# Patient Record
Sex: Male | Born: 1965 | Race: Black or African American | Hispanic: No | Marital: Married | Smoking: Light tobacco smoker
Health system: Southern US, Community
[De-identification: ages and names within clinical notes are randomized; demographics above are authoritative.]

## PROBLEM LIST (undated history)

## (undated) ENCOUNTER — Ambulatory Visit: Payer: 59 | Source: Home / Self Care

## (undated) DIAGNOSIS — K219 Gastro-esophageal reflux disease without esophagitis: Secondary | ICD-10-CM

## (undated) DIAGNOSIS — N419 Inflammatory disease of prostate, unspecified: Secondary | ICD-10-CM

## (undated) DIAGNOSIS — G4733 Obstructive sleep apnea (adult) (pediatric): Secondary | ICD-10-CM

## (undated) DIAGNOSIS — E7800 Pure hypercholesterolemia, unspecified: Secondary | ICD-10-CM

## (undated) DIAGNOSIS — G4730 Sleep apnea, unspecified: Secondary | ICD-10-CM

## (undated) DIAGNOSIS — J309 Allergic rhinitis, unspecified: Secondary | ICD-10-CM

## (undated) DIAGNOSIS — G4710 Hypersomnia, unspecified: Secondary | ICD-10-CM

## (undated) DIAGNOSIS — B353 Tinea pedis: Secondary | ICD-10-CM

## (undated) DIAGNOSIS — T7840XA Allergy, unspecified, initial encounter: Secondary | ICD-10-CM

## (undated) HISTORY — DX: Tinea pedis: B35.3

## (undated) HISTORY — DX: Allergic rhinitis, unspecified: J30.9

## (undated) HISTORY — DX: Hypersomnia, unspecified: G47.10

## (undated) HISTORY — DX: Allergy, unspecified, initial encounter: T78.40XA

## (undated) HISTORY — DX: Gastro-esophageal reflux disease without esophagitis: K21.9

## (undated) HISTORY — DX: Pure hypercholesterolemia, unspecified: E78.00

## (undated) HISTORY — DX: Inflammatory disease of prostate, unspecified: N41.9

## (undated) HISTORY — PX: TOE AMPUTATION: SHX809

## (undated) HISTORY — DX: Sleep apnea, unspecified: G47.30

## (undated) HISTORY — DX: Obstructive sleep apnea (adult) (pediatric): G47.33

---

## 1998-09-14 ENCOUNTER — Emergency Department (HOSPITAL_COMMUNITY): Admission: EM | Admit: 1998-09-14 | Discharge: 1998-09-15 | Payer: Self-pay | Admitting: Emergency Medicine

## 1998-09-16 ENCOUNTER — Encounter: Admission: RE | Admit: 1998-09-16 | Discharge: 1998-09-16 | Payer: Self-pay | Admitting: *Deleted

## 1998-10-03 ENCOUNTER — Encounter: Admission: RE | Admit: 1998-10-03 | Discharge: 1999-01-01 | Payer: Self-pay | Admitting: *Deleted

## 2003-06-02 ENCOUNTER — Emergency Department (HOSPITAL_COMMUNITY): Admission: EM | Admit: 2003-06-02 | Discharge: 2003-06-02 | Payer: Self-pay | Admitting: Emergency Medicine

## 2003-06-02 ENCOUNTER — Encounter: Payer: Self-pay | Admitting: Emergency Medicine

## 2003-06-09 ENCOUNTER — Inpatient Hospital Stay (HOSPITAL_COMMUNITY): Admission: AD | Admit: 2003-06-09 | Discharge: 2003-06-15 | Payer: Self-pay | Admitting: Orthopedic Surgery

## 2003-06-15 ENCOUNTER — Encounter: Payer: Self-pay | Admitting: Orthopedic Surgery

## 2003-06-22 ENCOUNTER — Ambulatory Visit (HOSPITAL_COMMUNITY): Admission: RE | Admit: 2003-06-22 | Discharge: 2003-06-23 | Payer: Self-pay | Admitting: Orthopedic Surgery

## 2004-09-28 ENCOUNTER — Ambulatory Visit (HOSPITAL_BASED_OUTPATIENT_CLINIC_OR_DEPARTMENT_OTHER): Admission: RE | Admit: 2004-09-28 | Discharge: 2004-09-28 | Payer: Self-pay | Admitting: Orthopedic Surgery

## 2005-09-05 ENCOUNTER — Ambulatory Visit: Payer: Self-pay | Admitting: Internal Medicine

## 2005-10-26 ENCOUNTER — Ambulatory Visit: Payer: Self-pay | Admitting: Internal Medicine

## 2006-04-23 ENCOUNTER — Ambulatory Visit: Payer: Self-pay | Admitting: Internal Medicine

## 2007-03-25 ENCOUNTER — Ambulatory Visit: Payer: Self-pay | Admitting: Internal Medicine

## 2007-09-03 ENCOUNTER — Encounter: Payer: Self-pay | Admitting: Internal Medicine

## 2007-09-03 ENCOUNTER — Ambulatory Visit: Payer: Self-pay | Admitting: Internal Medicine

## 2007-09-03 DIAGNOSIS — J309 Allergic rhinitis, unspecified: Secondary | ICD-10-CM

## 2007-09-03 DIAGNOSIS — K219 Gastro-esophageal reflux disease without esophagitis: Secondary | ICD-10-CM

## 2007-09-03 DIAGNOSIS — B353 Tinea pedis: Secondary | ICD-10-CM

## 2007-09-03 HISTORY — DX: Gastro-esophageal reflux disease without esophagitis: K21.9

## 2007-09-03 HISTORY — DX: Allergic rhinitis, unspecified: J30.9

## 2007-09-03 HISTORY — DX: Tinea pedis: B35.3

## 2007-09-03 LAB — CONVERTED CEMR LAB
Albumin: 4.4 g/dL (ref 3.5–5.2)
Basophils Absolute: 0 10*3/uL (ref 0.0–0.1)
Cholesterol: 217 mg/dL (ref 0–200)
Creatinine, Ser: 1.1 mg/dL (ref 0.4–1.5)
Direct LDL: 154.5 mg/dL
HCT: 42.4 % (ref 39.0–52.0)
Hemoglobin, Urine: NEGATIVE
Hemoglobin: 14.2 g/dL (ref 13.0–17.0)
Leukocytes, UA: NEGATIVE
Lymphocytes Relative: 38.8 % (ref 12.0–46.0)
MCHC: 33.4 g/dL (ref 30.0–36.0)
MCV: 86 fL (ref 78.0–100.0)
Monocytes Absolute: 0.6 10*3/uL (ref 0.2–0.7)
Neutro Abs: 4.2 10*3/uL (ref 1.4–7.7)
Neutrophils Relative %: 51.1 % (ref 43.0–77.0)
PSA: 0.5 ng/mL (ref 0.10–4.00)
Potassium: 4.8 meq/L (ref 3.5–5.1)
RDW: 12.8 % (ref 11.5–14.6)
Sodium: 148 meq/L — ABNORMAL HIGH (ref 135–145)
Total Bilirubin: 1 mg/dL (ref 0.3–1.2)
Total Protein: 7.8 g/dL (ref 6.0–8.3)
Urobilinogen, UA: 0.2 (ref 0.0–1.0)
pH: 6 (ref 5.0–8.0)

## 2007-12-03 ENCOUNTER — Ambulatory Visit: Payer: Self-pay | Admitting: Internal Medicine

## 2008-04-20 ENCOUNTER — Ambulatory Visit: Payer: Self-pay | Admitting: Internal Medicine

## 2008-04-20 DIAGNOSIS — J069 Acute upper respiratory infection, unspecified: Secondary | ICD-10-CM | POA: Insufficient documentation

## 2008-05-31 ENCOUNTER — Ambulatory Visit: Payer: Self-pay | Admitting: Internal Medicine

## 2009-02-25 ENCOUNTER — Encounter: Payer: Self-pay | Admitting: Internal Medicine

## 2009-07-13 ENCOUNTER — Ambulatory Visit: Payer: Self-pay | Admitting: Internal Medicine

## 2009-08-26 ENCOUNTER — Ambulatory Visit: Payer: Self-pay | Admitting: Internal Medicine

## 2009-08-26 DIAGNOSIS — G4710 Hypersomnia, unspecified: Secondary | ICD-10-CM

## 2009-08-26 DIAGNOSIS — M79609 Pain in unspecified limb: Secondary | ICD-10-CM

## 2009-08-26 DIAGNOSIS — E7800 Pure hypercholesterolemia, unspecified: Secondary | ICD-10-CM

## 2009-08-26 HISTORY — DX: Pure hypercholesterolemia, unspecified: E78.00

## 2009-08-26 HISTORY — DX: Hypersomnia, unspecified: G47.10

## 2009-08-26 LAB — CONVERTED CEMR LAB
ALT: 38 units/L (ref 0–53)
Alkaline Phosphatase: 64 units/L (ref 39–117)
Bilirubin, Direct: 0 mg/dL (ref 0.0–0.3)
CO2: 32 meq/L (ref 19–32)
Chloride: 107 meq/L (ref 96–112)
Sodium: 142 meq/L (ref 135–145)
Total CHOL/HDL Ratio: 4
Total Protein: 7.9 g/dL (ref 6.0–8.3)
Triglycerides: 90 mg/dL (ref 0.0–149.0)

## 2009-09-05 ENCOUNTER — Ambulatory Visit: Payer: Self-pay | Admitting: Pulmonary Disease

## 2009-09-06 ENCOUNTER — Encounter: Payer: Self-pay | Admitting: Pulmonary Disease

## 2009-09-06 ENCOUNTER — Ambulatory Visit (HOSPITAL_BASED_OUTPATIENT_CLINIC_OR_DEPARTMENT_OTHER): Admission: RE | Admit: 2009-09-06 | Discharge: 2009-09-06 | Payer: Self-pay | Admitting: Pulmonary Disease

## 2009-09-13 ENCOUNTER — Ambulatory Visit: Payer: Self-pay | Admitting: Pulmonary Disease

## 2009-09-14 ENCOUNTER — Telehealth (INDEPENDENT_AMBULATORY_CARE_PROVIDER_SITE_OTHER): Payer: Self-pay | Admitting: *Deleted

## 2009-09-21 ENCOUNTER — Ambulatory Visit: Payer: Self-pay | Admitting: Pulmonary Disease

## 2009-09-21 DIAGNOSIS — G4733 Obstructive sleep apnea (adult) (pediatric): Secondary | ICD-10-CM

## 2009-09-21 HISTORY — DX: Obstructive sleep apnea (adult) (pediatric): G47.33

## 2009-10-15 ENCOUNTER — Encounter: Payer: Self-pay | Admitting: Pulmonary Disease

## 2009-10-24 ENCOUNTER — Ambulatory Visit: Payer: Self-pay | Admitting: Pulmonary Disease

## 2009-12-17 ENCOUNTER — Encounter: Payer: Self-pay | Admitting: Pulmonary Disease

## 2010-04-24 ENCOUNTER — Ambulatory Visit: Payer: Self-pay | Admitting: Pulmonary Disease

## 2011-01-09 NOTE — Miscellaneous (Signed)
Summary: optimal pressure 14cm.  Clinical Lists Changes  Orders: Added new Referral order of DME Referral (DME) - Signed poor compliance...20% >= to 4 hrs.  optimal pressure appears to be 14cm
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
## 2011-04-27 NOTE — Discharge Summary (Signed)
NAME:  Collin Murphy, Collin Murphy                       ACCOUNT NO.:  [id number]   MEDICAL RECORD NO.:  [id number]                   PATIENT TYPE:  INP   LOCATION:  5015                                 FACILITY:  [facility]   PHYSICIAN:  Elana Alm. Thurston Hole, M.D.              DATE OF BIRTH:  12/14/1965   DATE OF ADMISSION:  06/09/2003  DATE OF DISCHARGE:  06/15/2003                                 DISCHARGE SUMMARY   ADMITTING DIAGNOSES:  1. Crush injury, left foot.  2. Necrotizing fasciitis, left foot.  3. Multiple fractures left foot.   DISCHARGE DIAGNOSES:  Left foot crush injury with infection and multiple  fractures.   HOSPITAL COURSE:  The patient is a 45 year old white male who underwent a  crush injury on June 23, _____.  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  He  went to [employer].  At that time underwent sutures of his lacerations  and was referred to orthopedics for care.  Was seen the next day.  Had a  markedly swollen foot.  No significant infection.  However, four days later  he returned to the office with a significant amount of swelling, significant  odorous drainage, and questionable necrotic toes third and fourth toes.  He  was taken to the operating room that night.  Underwent I&D by Elana Alm.  Thurston Hole, M.D.  He tolerated the procedure well.  Toes three and four were  still poorly vascularized.  Postoperative day one he began having bed side  irrigation and debridement and infectious disease was consulted for  antibiotic dosing.  Postoperative day two foot was dressed.  Third and  fourth toes were dusky.  His left calf was tender.  Doppler was ordered and  came back negative.  The patient was consented for a second I&D.  Postoperative day three patient underwent second I&D.  Placement of a wound  vac was done.  At that time the wound was clean with no odor.  Fourth toe  was still moderately dusky.  Third toe was only mildly dusky.  At that time  it was elected not to  amputate any toes for the possibility of them  revascularizing.  Postoperative day four/day one PICC line was ordered for  IV antibiotics.  The patient was tolerating the vac well.  His PCA was  discontinued.  Cultures showed gram-positive cocci in pairs.  Postoperative  day five/two patient was on Zosyn 4.5 mg IV q.8h., Percocet.  His PICC line  was inserted.  Postoperative day six/three home vac was applied so that he  was discharged to home in stable condition.  He will follow up with Nadara Mustard, M.D. for care of his foot.  He will be on IV antibiotics for six  weeks.  He will be Zosyn for two more weeks IV and then probably p.o.  antibiotics.  He is nonweightbearing on that foot.  He will  have vac changes  three times a week.  He will follow up with Nadara Mustard, M.D. on Friday.   DISCHARGE MEDICATIONS:  1. Zosyn 4.5 mg IV q.8h.  2. Percocet one to two q.4-6h. p.r.n. pain.  3. Robaxin 500 mg one p.o. q.6h. p.r.n. spasm.  4. Lovenox 40 mg subcutaneous daily until July 10.      Kirstin Shepperson, P.A.                  Robert A. Thurston Hole, M.D.    KS/MEDQ  D:  07/07/2003  T:  07/08/2003  Job:  [id number]

## 2011-04-27 NOTE — Op Note (Signed)
NAME:  Collin Murphy, Collin Murphy                       ACCOUNT NO.:  [id number]   MEDICAL RECORD NO.:  [id number]                   PATIENT TYPE:  INP   LOCATION:  5015                                 FACILITY:  [facility]   PHYSICIAN:  Elana Alm. Thurston Hole, M.D.              DATE OF BIRTH:  22-Jun-1966   DATE OF PROCEDURE:  06/09/2003  DATE OF DISCHARGE:                                 OPERATIVE REPORT   PREOPERATIVE DIAGNOSIS:  Severe crush injury, left foot, with deep foot  infection.   POSTOPERATIVE DIAGNOSIS:  Severe crush injury, left foot, with deep foot  infection, with possible devasculature of left third and fourth toes.   PROCEDURES:  1. Left foot irrigation and debridement.  2. Left foot fasciotomy.   SURGEON:  Dr. Salvatore Marvel.   ANESTHESIA:  General.   OPERATIVE TIME:  45 minutes.   COMPLICATIONS:  None.   DESCRIPTION OF PROCEDURE:  Collin Murphy is a 45 year old gentleman who had  sustained a severe left foot crush injury one week ago.  He has been on  antibiotics and non-weightbearing.  However, he has developed increase pain  and swelling in the foot over the past 24 hours with obvious infection at  this point with possible compartment syndrome and vascular compromise to his  third and fourth toes and is now to undergo thorough irrigation and  debridement and compartment fasciotomies of this.  Collin Murphy was brought to  the operating room on June 09, 2003 and placed on the operative table in the  supine position.  After an adequate level of general anesthesia was  obtained, his left foot and leg was prepped using sterile Betadine and  draped using sterile technique.  Initially, the foot and leg was  exsanguinated and a calf tourniquet elevated 300 mm.  Two separate incisions  were made, one over the fourth toe extending this all the way to the  proximal aspect of the mid foot, and one incision over the third toe  obliquely away from the first incision.  There was found to  be significant  serous purulent drainage and exuding purulence from the incisions which were  thoroughly cultured.  Underneath this was found to be deep purulent hematoma  in the dorsum of the foot, which was thoroughly debrided.  There was also  superficial epidermolysis over and around these crush injury areas and these  were debrided as well.  There was found to be significant deep infection  which was thoroughly debrided down to his extensor tendons and compartment  fasciotomies through the dorsum of the foot were carried out.  After this  was done and 7,000 cc of saline and antibiotic solution were jet lavaged  irrigated through these incisions and in and around all these areas in the  foot, the tourniquet was released and there was found to be some increased  improvement in the viability of the third toe, however, the fourth  toe  remained significantly difficult to evaluate for viable vascularity.  It  was, however, maintained at this point to be observed.  After this thorough  debridement and irrigation had been carried out, further irrigation was also  performed with saline and antibiotic solution.  At this point then, the  wounds were packed open with saline soaked Kerlix and sterile dressings were  applied.  The patient then awakened and taken to the recovery room in stable  condition.  Needle and sponge counts correct times two at the end of the  case.   FOLLOW-UP CARE:  Collin Murphy will be followed very closely for monitoring  the viability of his toes and his foot.  He is placed on IV antibiotics of  both Zosyn and vancomycin per infectious disease consultation and these  antibiotics will be adjusted accordingly based on cultures which were  obtained, both aerobic and anaerobic cultures obtained intraoperatively  today of the fluids and of the deep infected hematoma.                                               Robert A. Thurston Hole, M.D.    RAW/MEDQ  D:  06/09/2003  T:   06/09/2003  Job:  [id number]

## 2011-04-27 NOTE — Op Note (Signed)
NAME:  Collin Murphy, Collin Murphy                      ACCOUNT NO.:  [id number]   MEDICAL RECORD NO.:  [id number]                   PATIENT TYPE:  OIB   LOCATION:                                       FACILITY:  [facility]   PHYSICIAN:  Nadara Mustard, M.D.                DATE OF BIRTH:  [birth year]   DATE OF PROCEDURE:  06/22/2003  DATE OF DISCHARGE:                                 OPERATIVE REPORT   PREOPERATIVE DIAGNOSES:  1. Ischemic, necrotic left third and fourth toes, status post crush injury,     left foot.  2. Large open fasciotomy wounds x2, dorsum left foot.   PROCEDURES:  1. Irrigation and debridement of skin, soft tissue, and bone.  2. Amputation of third and fourth toes at the metatarsophalangeal joint.  3. Application of Apligraf.   SURGEON:  Nadara Mustard, M.D.   ANESTHESIA:  General.   ESTIMATED BLOOD LOSS:  Minimal.   ANTIBIOTICS:  1 g of Kefzol.   TOURNIQUET TIME:  None.   DISPOSITION:  To PACU in stable condition.  Plan for 23-hour observation.   INDICATIONS FOR PROCEDURE:  The patient is a 45 year old gentleman who is  status post a crush injury to his left foot.  He initially underwent  irrigation and debridement, fasciotomies, and application of a Wound V.A.C.,  and presents at this time for removal of the V.A.C., amputation of the  ischemic, necrotic left third and fourth toes, irrigation and debridement of  skin, soft tissue, and bone, and application of Apligraf.  Risks and  benefits of surgery were discussed including infection, neurovascular  injury, nonhealing of the wounds, need for additional surgery.  The patient  states he understands and wishes to proceed at this time.   DESCRIPTION OF PROCEDURE:  The patient was brought to OR Room #5 and  underwent a general LMA anesthetic.  After adequate level of anesthesia was  obtained, the patient's left lower extremity was prepped using Betadine  paint and draped into a sterile field.  Attention was first  focused on the  ischemic, necrotic third and fourth toes.  The toes were black, had no skin  turgor, had no bleeding.  The toes were amputated to the viable tissue at  the MTP joints.  The skin, soft tissue, and bone was then irrigated with  normal saline.  Hemostasis was then obtained.  The wound was closed using 2-  0 nylon with a far-near, near-far stitch.  The Apligraf was then meshed on  the back  table with a 1:1.5 mesher.  This was cut in two sections and was placed over  the two open fasciotomy wounds.  Mepitel dressing was then applied,  Bactroban cream, 4 x 4's, sterile Webril, and loosely-wrapped Coban.  The  patient was extubated and taken to PACU in stable condition.  Plan for 23-  hour observation and discharge to home.  Nadara Mustard, M.D.    MVD/MEDQ  D:  06/22/2003  T:  06/22/2003  Job:  [id number]

## 2011-04-27 NOTE — Op Note (Signed)
NAME:  Collin Murphy, Collin Murphy                       ACCOUNT NO.:  [id number]   MEDICAL RECORD NO.:  [id number]                   PATIENT TYPE:  INP   LOCATION:  5015                                 FACILITY:  [facility]   PHYSICIAN:  Elana Alm. Thurston Hole, M.D.              DATE OF BIRTH:  July 17, 1966   DATE OF PROCEDURE:  06/12/2003  DATE OF DISCHARGE:                                 OPERATIVE REPORT   PREOPERATIVE DIAGNOSIS:  Crush injury, left foot, with infection.   POSTOPERATIVE DIAGNOSIS:  Crush injury, left foot, with infection.   PROCEDURE:  Irrigation and debridement of left foot with application of VAC.   SURGEON:  Elana Alm. Thurston Hole, M.D.   ASSISTANT:  1. Nadara Mustard, M.D.  2. Kirstin Shepperson, P.A.   ANESTHESIA:  General.   OPERATIVE TIME:  30 minutes.   COMPLICATIONS:  None.   DESCRIPTION OF PROCEDURE:  Mr. Atallah was brought to the operating room on  June 12, 2003, and placed on the operating room table in the supine position.  After an adequate level of general anesthesia was obtained, his left foot  and leg was prepped using sterile Betadine and draped using sterile  technique.  Initially, the wound was evaluated.  There was some skin edge  necrosis around the wound which was debrided.  The third toe had  approximately 30 to 40% viability on the plantar surface; the rest of it  looked questionable for vascularity and viability, however, it was felt that  there was still a possibility that this toe might very well survive, and  thus it was not removed.  The fourth toe showed about 10 to 20% viability on  the plantar surface.  The dorsal surface again appeared questionable for  viability, but again with the possibility that it could possible be viable;  this toe was not removed either.  Thorough irrigation with saline and pulse  lavage was carried out.  There was excellent clean granulation tissue in the  dorsal bed of the wounds.  There was a skin bridge on the dorsum  which also  had questionable viability, but the undersurface of this was viable, and  thus it was left intact as well.  After this was done, then the [facility] system  was placed on the open wounds and sealed with Ioban.  The system was turned  on, and then the patient was awakened and taken to recovery room in stable  condition.  Needle and sponge counts were correct x2 at the end of the case.                                               Robert A. Thurston Hole, M.D.    RAW/MEDQ  D:  06/12/2003  T:  06/13/2003  Job:  [id number]

## 2011-04-27 NOTE — Op Note (Signed)
NAME:  Collin Murphy, Collin Murphy             ACCOUNT NO.:  [id number]   MEDICAL RECORD NO.:  [id number]          PATIENT TYPE:  AMB   LOCATION:  DSC                          FACILITY:  [facility]   PHYSICIAN:  Nadara Mustard, M.D.   DATE OF BIRTH:  [birth year]   DATE OF PROCEDURE:  09/28/2004  DATE OF DISCHARGE:                                 OPERATIVE REPORT   PREOPERATIVE DIAGNOSIS:  Metatarsalgia, left second metatarsal.   POSTOPERATIVE DIAGNOSIS:  Metatarsalgia, left second metatarsal.   PROCEDURE:  Left second metatarsal Weil osteotomy.   SURGEON:  Nadara Mustard, M.D.   ANESTHESIA:  LMA, which was converted to general endotracheal.   ESTIMATED BLOOD LOSS:  Minimal.   ANTIBIOTICS:  1 g Kefzol.   TOURNIQUET TIME:  Esmarch at the ankle for approximately 18 minutes.   DISPOSITION:  To PACU in stable condition.   INDICATION FOR PROCEDURE:  The patient is a 45 year old gentleman status  post a massive crush injury to the left foot, who has undergone amputations  of metatarsals of toes three and four.  The patient has had progressive  metatarsalgia due to a long, prominent second metatarsal and has developed  clawing of the second toe and presents at this time for a Weil osteotomy.  The risks and benefits were discussed, including infection, neurovascular  injury, persistent pain, nonunion of bone, nonhealing of the wound secondary  to previous trauma.  The patient states he understands and wishes to proceed  at this time.   DESCRIPTION OF PROCEDURE:  The patient was brought to OR room 5 and  underwent a general anesthetic.  After adequate level of anesthesia  obtained, the patient's left lower extremity was prepped using Duraprep and  draped into a sterile field.  The Esmarch was wrapped around the ankle for  tourniquet control.  A small 2 cm incision was made dorsally over the MTP  joint of the second toe.  This was carried sharply down to the MTP joint.  The capsule was released and  retractors were placed.  A Weil osteotomy was  performed.  The metatarsal head was transposed proximally and a 12 mm spin  screw was used to stabilize the metatarsal head.  The bony overhanging  prominence was then removed with the oscillating saw.  The wound was  cleansed.  The Esmarch was removed, and  hemostasis was obtained.  The skin was closed using a vertical mattress with  3-0 nylon.  The wound was covered with Adaptic, orthopedic sponges, sterile  Webril, and a Coban dressing.  The patient was extubated and taken to the  PACU in stable condition, planned for follow-up in the office in two weeks.       MVD/MEDQ  D:  09/28/2004  T:  09/28/2004  Job:  [id number]

## 2011-12-17 ENCOUNTER — Telehealth: Payer: Self-pay | Admitting: Internal Medicine

## 2011-12-17 NOTE — Telephone Encounter (Signed)
The pt called and is wanting to get in as soon as possible for congestion.  The patient was offered and apt on Wednesday with a different dr.  He was also informed that he could call back in the morning to take a same day apt.  The pt stated he would call back at 8am.

## 2011-12-18 ENCOUNTER — Encounter: Payer: Self-pay | Admitting: Endocrinology

## 2011-12-18 ENCOUNTER — Ambulatory Visit (INDEPENDENT_AMBULATORY_CARE_PROVIDER_SITE_OTHER): Payer: Managed Care, Other (non HMO) | Admitting: Endocrinology

## 2011-12-18 DIAGNOSIS — J069 Acute upper respiratory infection, unspecified: Secondary | ICD-10-CM

## 2011-12-18 MED ORDER — CEFUROXIME AXETIL 250 MG PO TABS: 250.0000 mg | ORAL_TABLET | Freq: Two times a day (BID) | ORAL | Status: AC

## 2011-12-18 MED ORDER — PROMETHAZINE-DM 6.25-15 MG/5ML PO SYRP: 5.0000 mL | ORAL_SOLUTION | Freq: Four times a day (QID) | ORAL | Status: AC | PRN

## 2011-12-18 MED ORDER — FLUTICASONE-SALMETEROL 100-50 MCG/DOSE IN AEPB: 1.0000 | INHALATION_SPRAY | Freq: Two times a day (BID) | RESPIRATORY_TRACT | Status: DC

## 2011-12-18 NOTE — Progress Notes (Signed)
  Subjective:    Patient ID: Collin Murphy, male    DOB: 1966-03-14, 46 y.o.   MRN: [id number]  HPI Pt states few days of slight "fullness" sensation at the right ear, but no assoc pain.  He has a slight dry cough, and wheezing in the chest. Past Medical History  Diagnosis Date  . ALLERGIC RHINITIS 09/03/2007  . GERD 09/03/2007  . HYPERCHOLESTEROLEMIA 08/26/2009  . HYPERSOMNIA 08/26/2009  . OBSTRUCTIVE SLEEP APNEA 09/21/2009  . TINEA PEDIS 09/03/2007  . Prostatitis     multiple episodes    Past Surgical History  Procedure Date  . Toe amputation     3rd and 4th toes amputated after forklift injury    History   Social History  . Marital Status: Married    Spouse Name: N/A    Number of Children: N/A  . Years of Education: N/A   Occupational History  . Mental health and Professional barber [employer]   Social History Main Topics  . Smoking status: Never Smoker   . Smokeless tobacco: Not on file  . Alcohol Use: Not on file  . Drug Use: Not on file  . Sexually Active: Not on file   Other Topics Concern  . Not on file   Social History Narrative   [employer]-major sociologyMarried-1990Children : 1 boy '93, 1 girl '98    No current outpatient prescriptions on file prior to visit.    No Known Allergies  Family History  Problem Relation Age of Onset  . Hypertension Mother   . Heart disease Mother     CAD  . Diabetes Father   . Cancer Neg Hx     No history Colon, Prostate Breast CA    BP 130/82  Pulse 57  Temp(Src) 98.3 F (36.8 C) (Oral)  Ht 5\' 9"  (1.753 m)  Wt 281 lb 1.9 oz (127.515 kg)  BMI 41.51 kg/m2  SpO2 95%    Review of Systems Denies nasal congestion and fever.    Objective:   Physical Exam VITAL SIGNS:  See vs page GENERAL: no distress head: no deformity eyes: no periorbital swelling, no proptosis external nose and ears are normal mouth: no lesion seen Both eac's are normal, but the tm's are red       Assessment & Plan:    URI, new

## 2011-12-18 NOTE — Patient Instructions (Addendum)
i have sent a prescription to your pharmacy, for an antibiotic, inhaler, and for cough medication.   I hope you feel better soon.  If you don't feel better by next week, please call your doctor.

## 2013-03-04 ENCOUNTER — Ambulatory Visit (INDEPENDENT_AMBULATORY_CARE_PROVIDER_SITE_OTHER): Payer: Managed Care, Other (non HMO) | Admitting: Internal Medicine

## 2013-03-04 ENCOUNTER — Encounter: Payer: Self-pay | Admitting: Internal Medicine

## 2013-03-04 VITALS — BP 126/70 | HR 64 | Temp 98.1°F | Resp 12 | Ht 69.0 in | Wt 269.0 lb

## 2013-03-04 DIAGNOSIS — R739 Hyperglycemia, unspecified: Secondary | ICD-10-CM

## 2013-03-04 DIAGNOSIS — Z0000 Encounter for general adult medical examination without abnormal findings: Secondary | ICD-10-CM

## 2013-03-04 DIAGNOSIS — E7800 Pure hypercholesterolemia, unspecified: Secondary | ICD-10-CM

## 2013-03-04 DIAGNOSIS — R7309 Other abnormal glucose: Secondary | ICD-10-CM

## 2013-03-04 DIAGNOSIS — G4733 Obstructive sleep apnea (adult) (pediatric): Secondary | ICD-10-CM

## 2013-03-04 NOTE — Progress Notes (Signed)
Subjective:    Patient ID: Collin Murphy, male    DOB: Jun 09, 1966, 47 y.o.   MRN: [id number]  HPI Collin Murphy has not been seen since 2010. Collin Murphy presents today for annual exam.  Collin Murphy reports that Collin Murphy has pedal edema, more left than right. The swelling is progressive during the course of the day. As the leg swells the pain gets worse. Collin Murphy is also having some pain in the buttock and calve.  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   Collin Murphy has a h/o OSA with sleep study in 2010 with AHI 22. Collin Murphy was not able to continue C-PAP due to $$.   Collin Murphy has not had any major medical illness, surgery or injury. Is current with the dentist. Collin Murphy has had ophthal exam last month. No regular exercise program. Diet is pretty healthy.  Past Medical History  Diagnosis Date  . ALLERGIC RHINITIS 09/03/2007  . GERD 09/03/2007  . HYPERCHOLESTEROLEMIA 08/26/2009  . HYPERSOMNIA 08/26/2009  . OBSTRUCTIVE SLEEP APNEA 09/21/2009  . TINEA PEDIS 09/03/2007  . Prostatitis     multiple episodes   Past Surgical History  Procedure Laterality Date  . Toe amputation      3rd and 4th toes amputated after forklift injury   Family History  Problem Relation Age of Onset  . Hypertension Mother   . Heart disease Mother     CAD  . Diabetes Father   . Cancer Neg Hx     No history Colon, Prostate Breast CA  . Gout Brother   . COPD Brother    History   Social History  . Marital Status: Married    Spouse Name: N/A    Number of Children: 2  . Years of Education: 16   Occupational History  . Mental health and Professional barber [employer]   Social History Main Topics  . Smoking status: Never Smoker   . Smokeless tobacco: Never Used  . Alcohol Use: Yes     Comment: occ/rare  . Drug Use: No  . Sexually Active: Yes -- Male partner(s)   Other Topics Concern  . Not on file   Social History Narrative   HSG. College [employer]ology. Completed barber[employer] college. Married-1990   Children : 1  boy '93, 1 girl '98. Work Emergency planning/management officer.             Current Outpatient Prescriptions on File Prior to Visit  Medication Sig Dispense Refill  . Fluticasone-Salmeterol (ADVAIR DISKUS) 100-50 MCG/DOSE AEPB Inhale 1 puff into the lungs 2 (two) times daily.  1 each  2   No current facility-administered medications on file prior to visit.      Review of Systems Constitutional:  Negative for fever, chills, activity change and unexpected weight change.  HEENT:  Negative for hearing loss, ear pain, congestion, neck stiffness and postnasal drip. Negative for sore throat or swallowing problems. Negative for dental complaints.   Eyes: Negative for vision loss or change in visual acuity.  Respiratory: Negative for chest tightness and wheezing. Negative for DOE.   Cardiovascular: Negative for chest pain or palpitations. No decreased exercise tolerance Gastrointestinal: No change in bowel habit. No bloating or gas. No reflux or indigestion Genitourinary: Negative for urgency, frequency, flank pain and difficulty urinating.  Musculoskeletal: Negative for myalgias, back pain, arthralgias and gait problem.  Neurological: Negative for dizziness, tremors, weakness and headaches.  Hematological: Negative for adenopathy.  Psychiatric/Behavioral: Negative for behavioral problems and dysphoric mood.  Objective:   Physical Exam Filed Vitals:   03/04/13 1328  BP: 126/70  Pulse: 64  Temp: 98.1 F (36.7 C)  Resp: 12   Wt Readings from Last 3 Encounters:  03/04/13 269 lb (122.018 kg)  12/18/11 281 lb 1.9 oz (127.515 kg)  04/24/10 276 lb (125.193 kg)   Gen'l: Well nourished well developed AA male in no acute distress  HEENT: Head: Normocephalic and atraumatic. Right Ear: External ear normal. EAC/TM nl. Left Ear: External ear normal.  EAC/TM nl. Nose: Nose normal. Mouth/Throat: Oropharynx is clear and moist. Dentition - native, in good repair. No buccal or palatal lesions. Posterior pharynx clear.  Eyes: Conjunctivae and sclera clear. EOM intact. Pupils are equal, round, and reactive to light. Right eye exhibits no discharge. Left eye exhibits no discharge. Neck: Normal range of motion. Neck supple. No JVD present. No tracheal deviation present. No thyromegaly present.  Cardiovascular: Normal rate, regular rhythm, no gallop, no friction rub, no murmur heard.      Quiet precordium. 2+ radial and DP pulses . No carotid bruits Pulmonary/Chest: Effort normal. No respiratory distress or increased WOB, no wheezes, no rales. No chest wall deformity or CVAT. Abdomen: Soft. Bowel sounds are normal in all quadrants. Collin Murphy exhibits no distension, no tenderness, no rebound or guarding, No heptosplenomegaly  Genitourinary:  deferred Musculoskeletal: Normal range of motion. Collin Murphy exhibits no edema and no tenderness.       Small and large joints without redness, synovial thickening or deformity. Full range of motion preserved about all small, median and large joints.  Lymphadenopathy:    Collin Murphy has no cervical or supraclavicular adenopathy.  Neurological: Collin Murphy is alert and oriented to person, place, and time. CN II-XII intact. DTRs 2+ and symmetrical biceps, radial and patellar tendons. Cerebellar function normal with no tremor, rigidity, normal gait and station.  Skin: Skin is warm and dry. No rash noted. No erythema.  Psychiatric: Collin Murphy has a normal mood and affect. His behavior is normal. Thought content normal.         Assessment & Plan:

## 2013-03-04 NOTE — Patient Instructions (Addendum)
Thanks for coming back  Leg Swelling - venous insufficiency. A common problem of the venous circulation. Plan Knee high stockings,  Elevate legs  Weight management - the biggest factor in your health: /Diet management: smart food choices, PORTION SIZE CONTROL, regular exercise. Goal - to loose 1-2 lbs.month. Target weight - 220 lbs - 70 lbs to loose.  5-6 years.  Routine care - full labs when you come back.   Please sign up for my chart.     Venous Stasis and Chronic Venous Insufficiency As people age, the veins located in their legs may weaken and stretch. When veins weaken and lose the ability to pump blood effectively, the condition is called chronic venous insufficiency (CVI) or venous stasis. Almost all veins return blood back to the heart. This happens by:  The force of the heart pumping fresh blood pushes blood back to the heart.  Blood flowing to the heart from the force of gravity. In the deep veins of the legs, blood has to fight gravity and flow upstream back to the heart. Here, the leg muscles contract to pump blood back toward the heart. Vein walls are elastic, and many veins have small valves that only allow blood to flow in one direction. When leg muscles contract, they push inward against the elastic vein walls. This squeezes blood upward, opens the valves, and moves blood toward the heart. When leg muscles relax, the vein wall also relaxes and the valves inside the vein close to prevent blood from flowing backward. This method of pumping blood out of the legs is called the venous pump. CAUSES  The venous pump works best while walking and leg muscles are contracting. But when a person sits or stands, blood pressure in leg veins can build. Deep veins are usually able to withstand short periods of inactivity, but long periods of inactivity (and increased pressure) can stretch, weaken, and damage vein walls. High blood pressure can also stretch and damage vein walls. The veins may  no longer be able to pump blood back to the heart. Venous hypertension (high blood pressure inside veins) that lasts over time is a primary cause of CVI. CVI can also be caused by:   Deep vein thrombosis, a condition where a thrombus (blood clot) blocks blood flow in a vein.  Phlebitis, an inflammation of a superficial vein that causes a blood clot to form. Other risk factors for CVI may include:   Heredity.  Obesity.  Pregnancy.  Sedentary lifestyle.  Smoking.  Jobs requiring long periods of standing or sitting in one place.  Age and gender:  Women in their 56's and 68's and men in their 72's are more prone to developing CVI. SYMPTOMS  Symptoms of CVI may include:   Varicose veins.  Ulceration or skin breakdown.  Lipodermatosclerosis, a condition that affects the skin just above the ankle, usually on the inside surface. Over time the skin becomes brown, smooth, tight and often painful. Those with this condition have a high risk of developing skin ulcers.  Reddened or discolored skin on the leg.  Swelling. DIAGNOSIS  Your caregiver can diagnose CVI after performing a careful medical history and physical examination. To confirm the diagnosis, the following tests may also be ordered:   Duplex ultrasound.  Plethysmography (tests blood flow).  Venograms (x-ray using a special dye). TREATMENT The goals of treatment for CVI are to restore a person to an active life and to minimize pain or disability. Typically, CVI does not pose a  serious threat to life or limb, and with proper treatment most people with this condition can continue to lead active lives. In most cases, mild CVI can be treated on an outpatient basis with simple procedures. Treatment methods include:   Elastic compression socks.  Sclerotherapy, a procedure involving an injection of a material that "dissolves" the damaged veins. Other veins in the network of blood vessels take over the function of the damaged  veins.  Vein stripping (an older procedure less commonly used).  Laser Ablation surgery.  Valve repair. HOME CARE INSTRUCTIONS   Elastic compression socks must be worn every day. They can help with symptoms and lower the chances of the problem getting worse, but they do not cure the problem.  Only take over-the-counter or prescription medicines for pain, discomfort, or fever as directed by your caregiver.  Your caregiver will review your other medications with you. SEEK MEDICAL CARE IF:   You are confused about how to take your medications.  There is redness, swelling, or increasing pain in the affected area.  There is a red streak or line that extends up or down from the affected area.  There is a breakdown or loss of skin in the affected area, even if the breakdown is small.  You develop an unexplained oral temperature above 102 F (38.9 C).  There is an injury to the affected area. SEEK IMMEDIATE MEDICAL CARE IF:   There is an injury and open wound to the affected area.  Pain is not adequately relieved with pain medication prescribed or becomes severe.  An oral temperature above 102 F (38.9 C) develops.  The foot/ankle below the affected area becomes suddenly numb or the area feels weak and hard to move. MAKE SURE YOU:   Understand these instructions.  Will watch your condition.  Will get help right away if you are not doing well or get worse. Document Released: 04/01/2007 Document Revised: 02/18/2012 Document Reviewed: 06/09/2007 [facility] Patient Information 2013 [place], [place].

## 2013-03-05 DIAGNOSIS — Z0000 Encounter for general adult medical examination without abnormal findings: Secondary | ICD-10-CM | POA: Insufficient documentation

## 2013-03-05 NOTE — Assessment & Plan Note (Signed)
Reviewed the medical implications of morbid obesity and the critical importance of addressing this issue.  Plan - Diet management: smart food choices, PORTION SIZE CONTROL, regular exercise. Goal - to loose 1-2 lbs.month. Target weight - 220 lb

## 2013-03-05 NOTE — Assessment & Plan Note (Signed)
Patient's last LDL was better than goal of 130 or less  Plan Lipid panel with recommendations to follow

## 2013-03-05 NOTE — Assessment & Plan Note (Addendum)
Interval medical history is w/o major medical illness, surgery or injury. Physical exam is notable for left toe amputation and obesity. Labs ordered and pending. Immunization brought up to date. Discussed with his weight management. Reviewed erectile function which by his report is normal for his age. He has had by description minor external hemorrhoid and is advised in care. He does report peripheral edema that develops during the day c/w venous insufficiency and he is instructed in treatment. 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  In summary - a very nice man who, except for his weight, appears to be medically stable. If his labs are normal and he continues to do well he should return for full exam, 24 lbs lighter, in 2 years.

## 2013-03-05 NOTE — Assessment & Plan Note (Signed)
Reviewed his sleep study. Discussed the medical ramifications of untreated sleep apnea.  Plan He is to check, again, about insurance coverage for CPAP  Weight management.

## 2013-12-01 ENCOUNTER — Ambulatory Visit (INDEPENDENT_AMBULATORY_CARE_PROVIDER_SITE_OTHER): Payer: Managed Care, Other (non HMO) | Admitting: Internal Medicine

## 2013-12-01 ENCOUNTER — Encounter: Payer: Self-pay | Admitting: Internal Medicine

## 2013-12-01 VITALS — BP 134/70 | HR 76 | Temp 97.7°F | Wt 288.0 lb

## 2013-12-01 DIAGNOSIS — H9319 Tinnitus, unspecified ear: Secondary | ICD-10-CM

## 2013-12-01 DIAGNOSIS — H6980 Other specified disorders of Eustachian tube, unspecified ear: Secondary | ICD-10-CM

## 2013-12-01 DIAGNOSIS — H6983 Other specified disorders of Eustachian tube, bilateral: Secondary | ICD-10-CM

## 2013-12-01 DIAGNOSIS — H9311 Tinnitus, right ear: Secondary | ICD-10-CM

## 2013-12-01 NOTE — Progress Notes (Signed)
   Subjective:    Patient ID: Collin Murphy, male    DOB: Mar 13, 1966, 47 y.o.   MRN: [id number]  HPI Mr. Klecka presents for several months of ringing in the right ear. No drainage. No barotrauma. It doesn't interfere with sleep. No hearing loss but sound is muffled. No fever or chills.   Past Medical History  Diagnosis Date  . ALLERGIC RHINITIS 09/03/2007  . GERD 09/03/2007  . HYPERCHOLESTEROLEMIA 08/26/2009  . HYPERSOMNIA 08/26/2009  . OBSTRUCTIVE SLEEP APNEA 09/21/2009  . TINEA PEDIS 09/03/2007  . Prostatitis     multiple episodes   Past Surgical History  Procedure Laterality Date  . Toe amputation      3rd and 4th toes amputated after forklift injury   Family History  Problem Relation Age of Onset  . Hypertension Mother   . Heart disease Mother     CAD  . Diabetes Father   . Cancer Neg Hx     No history Colon, Prostate Breast CA  . Gout Brother   . COPD Brother    History   Social History  . Marital Status: Married    Spouse Name: N/A    Number of Children: 2  . Years of Education: 16   Occupational History  . Mental health and Professional barber [employer]   Social History Main Topics  . Smoking status: Never Smoker   . Smokeless tobacco: Never Used  . Alcohol Use: Yes     Comment: occ/rare  . Drug Use: No  . Sexual Activity: Yes    Partners: Female   Other Topics Concern  . Not on file   Social History Narrative   HSG. College [employer]ology. Completed barber's college. Married-1990   Children : 1 boy '93, 1 girl '98. Work Emergency planning/management officer.             Current Outpatient Prescriptions on File Prior to Visit  Medication Sig Dispense Refill  . Fluticasone-Salmeterol (ADVAIR DISKUS) 100-50 MCG/DOSE AEPB Inhale 1 puff into the lungs 2 (two) times daily.  1 each  2   No current facility-administered medications on file prior to visit.      Review of Systems System review is negative for any constitutional, cardiac, pulmonary, GI or  neuro symptoms or complaints other than as described in the HPI.     Objective:   Physical Exam Filed Vitals:   12/01/13 1516  BP: 134/70  Pulse: 76  Temp: 97.7 F (36.5 C)   Wt Readings from Last 3 Encounters:  12/01/13 288 lb (130.636 kg)  03/04/13 269 lb (122.018 kg)  12/18/11 281 lb 1.9 oz (127.515 kg)   Gen'l - o0bese man in no distress HEENT- Muddy/AT, EACs clear, TMs with poor movement with insuffulation, with 256 Hz tuning fork hearing is normal but lateralizes to the right with fork on bone. Cor- RRR Pulm - normal        Assessment & Plan:  1. Eustachian tube dysfunction - the blockage of the eustachian tube that dampens the movement of the eardrum and leads to muffled hearing.  2. Tinnitus Ringing in the ear on one side. Most of the time this is a benign condition and long lasting. Rarely it can be a sign of a more serious problem  Plan Referral to [facility] for hearing testing  If one sided hearing loss will move ahead with MRI brain to fule out an acoustic neuroma.,

## 2013-12-01 NOTE — Patient Instructions (Signed)
1. Eustachian tube dysfunction - the blockage of the eustachian tube that dampens the movement of the eardrum and leads to muffled hearing.  2. Tinnitus Ringing in the ear on one side. Most of the time this is a benign condition and long lasting. Rarely it can be a sign of a more serious problem  Plan Referral to [facility] for hearing testing  If one sided hearing loss will move ahead with MRI brain to fule out an acoustic neuroma.,

## 2013-12-01 NOTE — Progress Notes (Signed)
Pre visit review using our clinic review tool, if applicable. No additional management support is needed unless otherwise documented below in the visit note. 

## 2013-12-22 ENCOUNTER — Telehealth: Payer: Self-pay | Admitting: Internal Medicine

## 2013-12-22 NOTE — Telephone Encounter (Signed)
Rec'd from [facility] forward 3 pages to Dr. Norins °

## 2014-10-18 ENCOUNTER — Encounter: Payer: Self-pay | Admitting: Internal Medicine

## 2014-10-18 ENCOUNTER — Ambulatory Visit (INDEPENDENT_AMBULATORY_CARE_PROVIDER_SITE_OTHER): Payer: Managed Care, Other (non HMO) | Admitting: Internal Medicine

## 2014-10-18 VITALS — BP 120/80 | HR 62 | Temp 98.7°F | Resp 14 | Wt 283.1 lb

## 2014-10-18 DIAGNOSIS — K219 Gastro-esophageal reflux disease without esophagitis: Secondary | ICD-10-CM

## 2014-10-18 DIAGNOSIS — J209 Acute bronchitis, unspecified: Secondary | ICD-10-CM

## 2014-10-18 MED ORDER — AZITHROMYCIN 250 MG PO TABS: ORAL_TABLET | ORAL | Status: DC

## 2014-10-18 MED ORDER — HYDROCODONE-HOMATROPINE 5-1.5 MG/5ML PO SYRP: 5.0000 mL | ORAL_SOLUTION | Freq: Four times a day (QID) | ORAL | Status: DC | PRN

## 2014-10-18 NOTE — Patient Instructions (Signed)
Reflux of gastric acid may be asymptomatic as this may occur mainly during sleep.The triggers for reflux  include stress; the "aspirin family" ; alcohol; peppermint; and caffeine (coffee, tea, cola, and chocolate). The aspirin family would include aspirin and the nonsteroidal agents such as ibuprofen &  Naproxen. Tylenol would not cause reflux. If having symptoms ; food & drink should be avoided for @ least 2 hours before going to bed. Prilosec OTC if symptoms persist.     If having head symptoms: Plain Mucinex for thick secretions. Nasal cleansing in the shower as discussed with lather of mild shampoo.After 10 seconds wash off lather while  exhaling through nostrils. Make sure that all residual soap is removed to prevent irritation.  Fluticasone 1 spray in each nostril twice a day as needed. Use the "crossover" technique into opposite nostril spraying toward opposite ear @ 45 degree angle, not straight up into nostril.  Plain Allegra (NOT D )  160 daily , Loratidine 10 mg , OR Zyrtec 10 mg @ bedtime  as needed for itchy eyes & sneezing.  Fill the  prescription for Zithromax it the green secretions return.

## 2014-10-18 NOTE — Progress Notes (Signed)
Pre visit review using our clinic review tool, if applicable. No additional management support is needed unless otherwise documented below in the visit note. 

## 2014-10-18 NOTE — Progress Notes (Signed)
   Subjective:    Patient ID: Collin Murphy, male    DOB: Aug 28, 1966, 48 y.o.   MRN: [id number]  HPI   Symptoms began 2 weeks ago as a nonproductive cough. Subsequently over the next week he did have some green sputum.  He described discomfort in his abdomen; the paroxysms of cough may be a component in this discomfort. He also described being bloated and belching. He will take Pepsi for the belching.  Over the weekend 11/6-11/8 he took TheraFlu with an improvement in his cough. It is no longer productive of purulent secretions  Initially during  the first few days of his illness he also had some sneezing and wheezing which has resolved.  He denies significant symptoms of upper respiratory tract infection . He's never smoked.  He states he may have had asthma. He does have Advair listed as a medication.    Review of Systems Frontal headache, facial pain , nasal purulence, dental pain, sore throat , otic pain or otic discharge denied. No fever , chills or sweats.  Unexplained weight loss,  significant dyspepsia, or dysphagia are denied.     Objective:   Physical Exam  General appearance:good health ;well nourished; no acute distress or increased work of breathing is present.  No  lymphadenopathy about the head, neck, or axilla noted.   Eyes: No conjunctival inflammation or lid edema is present. There is no scleral icterus.  Ears:  External ear exam shows no significant lesions or deformities.  Otoscopic examination reveals clear canals, tympanic membranes are intact bilaterally without bulging, retraction, inflammation or discharge.  Nose:  External nasal examination shows no deformity or inflammation. Nasal mucosa are pink and moist without lesions or exudates. No septal dislocation or deviation.No obstruction to airflow.   Oral exam: Dental hygiene is good; lips and gums are healthy appearing.There is no oropharyngeal erythema or exudate noted.   Neck:  No deformities,  thyromegaly, masses, or tenderness noted.   Supple with full range of motion without pain.   Heart:  Normal rate and regular rhythm. S1 and S2 normal without gallop, murmur, click, rub or other extra sounds.   Lungs:Chest clear to auscultation; no wheezes, rhonchi,rales ,or rubs present.No increased work of breathing.    No abdominal tenderness  Abdomen: Slightly protuberant;bowel sounds normal, soft and non-tender without masses, organomegaly or hernias noted.  No guarding or rebound  Extremities:  No cyanosis, edema, or clubbing  noted    Skin: Warm & dry w/o jaundice or tenting.           Assessment & Plan:  #1 acute bronchitis w/o bronchospasm ; possible ERD component to cough. Purulence has resolved. #2 no significant URI Plan: See orders and recommendations

## 2014-10-27 ENCOUNTER — Other Ambulatory Visit: Payer: Self-pay | Admitting: Family

## 2014-10-27 ENCOUNTER — Ambulatory Visit (INDEPENDENT_AMBULATORY_CARE_PROVIDER_SITE_OTHER): Payer: Managed Care, Other (non HMO) | Admitting: Family

## 2014-10-27 ENCOUNTER — Encounter: Payer: Self-pay | Admitting: Family

## 2014-10-27 VITALS — BP 124/78 | HR 62 | Temp 98.5°F | Resp 18 | Ht 69.0 in | Wt 286.8 lb

## 2014-10-27 DIAGNOSIS — G4733 Obstructive sleep apnea (adult) (pediatric): Secondary | ICD-10-CM

## 2014-10-27 MED ORDER — FLUTICASONE-SALMETEROL 100-50 MCG/DOSE IN AEPB: 1.0000 | INHALATION_SPRAY | Freq: Two times a day (BID) | RESPIRATORY_TRACT | Status: DC

## 2014-10-27 NOTE — Progress Notes (Signed)
   Subjective:    Patient ID: Collin Murphy, male    DOB: 05-16-1966, 48 y.o.   MRN: [id number]  Chief Complaint  Patient presents with  . Establish Care    wants referral for sleep study    HPI:  Collin Murphy is a 48 y.o. male who presents today for an office visit to discuss a sleep study.  Had a previous sleep study, insurance company would not cover his CPAP machine and was working with Dr. Crissie Sickles to get the equipment covered. Previous machine was taken back by home health and not currently using CPAP/BiPAP. Continues to experience daytime fatigue and sleepiness, and snoring. Wife has monitored him losing his breath periodically throughout the night. Daytime drowsiness does effect his work at times. Has tried breath right strips with no relief or change.   Had previous sleep study in 2010 which revealed moderate obstructive sleep apnea syndrome and oxygen levels as low as 81%.    Wt Readings from Last 3 Encounters:  10/27/14 286 lb 12.8 oz (130.092 kg)  10/18/14 283 lb 2 oz (128.425 kg)  12/01/13 288 lb (130.636 kg)   No Known Allergies  Current Outpatient Prescriptions on File Prior to Visit  Medication Sig Dispense Refill  . HYDROcodone-homatropine (HYDROMET) 5-1.5 MG/5ML syrup Take 5 mLs by mouth every 6 (six) hours as needed for cough. 120 mL 0   No current facility-administered medications on file prior to visit.   Past Medical History  Diagnosis Date  . ALLERGIC RHINITIS 09/03/2007  . GERD 09/03/2007  . HYPERCHOLESTEROLEMIA 08/26/2009  . HYPERSOMNIA 08/26/2009  . OBSTRUCTIVE SLEEP APNEA 09/21/2009  . TINEA PEDIS 09/03/2007  . Prostatitis     multiple episodes    Review of Systems  See HPI    Objective:    BP 124/78 mmHg  Pulse 62  Temp(Src) 98.5 F (36.9 C) (Oral)  Resp 18  Ht 5\' 9"  (1.753 m)  Wt 286 lb 12.8 oz (130.092 kg)  BMI 42.33 kg/m2  SpO2 95% Nursing note and vital signs reviewed.  Physical Exam  Constitutional: He is oriented to  person, place, and time. No distress.  Obese male seated in the chair, dressed appropriately and appears his stated age.   Cardiovascular: Normal rate, regular rhythm and normal heart sounds.   Pulmonary/Chest: Effort normal. He has wheezes (Expiratory wheezes bilateral upper lobes).  Neurological: He is alert and oriented to person, place, and time.  Skin: Skin is warm and dry.  Psychiatric: He has a normal mood and affect. His behavior is normal. Judgment and thought content normal.       Assessment & Plan:

## 2014-10-27 NOTE — Assessment & Plan Note (Signed)
Previous Dx of sleep apnea and currently does not have CPAP machine. Continues to experience fatigue and interruptions in his work secondary to sleepiness. Sleep study ordered. Discuss continued lifestyle changes in attempts to lose weight through exercise and diet. Follow up after sleep study.

## 2014-10-27 NOTE — Progress Notes (Signed)
Pre visit review using our clinic review tool, if applicable. No additional management support is needed unless otherwise documented below in the visit note. 

## 2014-10-27 NOTE — Patient Instructions (Signed)
Thank you for choosing [facility].  Summary/Instructions:  Referrals have been made during this visit. You should expect to hear back from our schedulers in about 7-10 days in regards to establishing an appointment with the specialists we discussed.   Sleep Apnea  Sleep apnea is a sleep disorder characterized by abnormal pauses in breathing while you sleep. When your breathing pauses, the level of oxygen in your blood decreases. This causes you to move out of deep sleep and into light sleep. As a result, your quality of sleep is poor, and the system that carries your blood throughout your body (cardiovascular system) experiences stress. If sleep apnea remains untreated, the following conditions can develop:  High blood pressure (hypertension).  Coronary artery disease.  Inability to achieve or maintain an erection (impotence).  Impairment of your thought process (cognitive dysfunction). There are three types of sleep apnea: 1. Obstructive sleep apnea--Pauses in breathing during sleep because of a blocked airway. 2. Central sleep apnea--Pauses in breathing during sleep because the area of the brain that controls your breathing does not send the correct signals to the muscles that control breathing. 3. Mixed sleep apnea--A combination of both obstructive and central sleep apnea. RISK FACTORS The following risk factors can increase your risk of developing sleep apnea:  Being overweight.  Smoking.  Having narrow passages in your nose and throat.  Being of older age.  Being male.  Alcohol use.  Sedative and tranquilizer use.  Ethnicity. Among individuals younger than 35 years, African Americans are at increased risk of sleep apnea. SYMPTOMS   Difficulty staying asleep.  Daytime sleepiness and fatigue.  Loss of energy.  Irritability.  Loud, heavy snoring.  Morning headaches.  Trouble concentrating.  Forgetfulness.  Decreased interest in sex. DIAGNOSIS  In  order to diagnose sleep apnea, your caregiver will perform a physical examination. Your caregiver may suggest that you take a home sleep test. Your caregiver may also recommend that you spend the night in a sleep lab. In the sleep lab, several monitors record information about your heart, lungs, and brain while you sleep. Your leg and arm movements and blood oxygen level are also recorded. TREATMENT The following actions may help to resolve mild sleep apnea:  Sleeping on your side.   Using a decongestant if you have nasal congestion.   Avoiding the use of depressants, including alcohol, sedatives, and narcotics.   Losing weight and modifying your diet if you are overweight. There also are devices and treatments to help open your airway:  Oral appliances. These are custom-made mouthpieces that shift your lower jaw forward and slightly open your bite. This opens your airway.  Devices that create positive airway pressure. This positive pressure "splints" your airway open to help you breathe better during sleep. The following devices create positive airway pressure:  Continuous positive airway pressure (CPAP) device. The CPAP device creates a continuous level of air pressure with an air pump. The air is delivered to your airway through a mask while you sleep. This continuous pressure keeps your airway open.  Nasal expiratory positive airway pressure (EPAP) device. The EPAP device creates positive air pressure as you exhale. The device consists of single-use valves, which are inserted into each nostril and held in place by adhesive. The valves create very little resistance when you inhale but create much more resistance when you exhale. That increased resistance creates the positive airway pressure. This positive pressure while you exhale keeps your airway open, making it easier to breath when  you inhale again.  Bilevel positive airway pressure (BPAP) device. The BPAP device is used mainly in  patients with central sleep apnea. This device is similar to the CPAP device because it also uses an air pump to deliver continuous air pressure through a mask. However, with the BPAP machine, the pressure is set at two different levels. The pressure when you exhale is lower than the pressure when you inhale.  Surgery. Typically, surgery is only done if you cannot comply with less invasive treatments or if the less invasive treatments do not improve your condition. Surgery involves removing excess tissue in your airway to create a wider passage way. Document Released: 11/16/2002 Document Revised: 03/23/2013 Document Reviewed: 04/03/2012 [facility] Patient Information 2015 [place], [place]. This information is not intended to replace advice given to you by your health care provider. Make sure you discuss any questions you have with your health care provider.

## 2014-11-18 ENCOUNTER — Other Ambulatory Visit: Payer: Self-pay

## 2014-11-18 MED ORDER — FLUTICASONE-SALMETEROL 100-50 MCG/DOSE IN AEPB: 1.0000 | INHALATION_SPRAY | Freq: Two times a day (BID) | RESPIRATORY_TRACT | Status: DC

## 2014-11-24 ENCOUNTER — Telehealth: Payer: Self-pay | Admitting: Family

## 2014-11-24 DIAGNOSIS — G4730 Sleep apnea, unspecified: Secondary | ICD-10-CM

## 2014-11-24 NOTE — Telephone Encounter (Signed)
Pt is aware.  

## 2014-11-24 NOTE — Telephone Encounter (Signed)
Please call the patient and inform him that his insurance company is declining his sleep study at this time because not meeting the criteria. Therefore to help him, I have placed a referral to pulmonology for sleep medicine.

## 2014-12-10 ENCOUNTER — Encounter (HOSPITAL_BASED_OUTPATIENT_CLINIC_OR_DEPARTMENT_OTHER): Payer: Managed Care, Other (non HMO)

## 2015-01-04 ENCOUNTER — Institutional Professional Consult (permissible substitution): Payer: Managed Care, Other (non HMO) | Admitting: Pulmonary Disease

## 2016-01-06 ENCOUNTER — Telehealth: Payer: Self-pay | Admitting: Family

## 2016-01-06 NOTE — Telephone Encounter (Signed)
Patient Name: Collin Murphy  DOB: 10-20-1966    Initial Comment Caller states, had a fall, injured his Rt elbow on Monday, now has a large red/purple bruise on arm , there is swelling too   Nurse Assessment  Nurse: Mallie Mussel, RN, Alveta Heimlich Date/Time (Eastern Time): 01/06/2016 1:01:30 PM  Confirm and document reason for call. If symptomatic, describe symptoms. You must click the next button to save text entered. ---Caller states that he fell Monday and injured his right elbow. There is redness and swelling. The redness is rather large. Denies fever. He denies pain, but there is "tightness."  Has the patient traveled out of the country within the last 30 days? ---No  Does the patient have any new or worsening symptoms? ---Yes  Will a triage be completed? ---Yes  Related visit to physician within the last 2 weeks? ---No  Does the PT have any chronic conditions? (i.e. diabetes, asthma, etc.) ---No  Is this a behavioral health or substance abuse call? ---No     Guidelines    Guideline Title Affirmed Question Affirmed Notes  Elbow Injury Large swelling or bruise (> 2 inches or 5 cm)    Final Disposition User   See Physician within [place], RN, Alveta Heimlich    Comments  Caller is asking for an appointment for tomorrow (Saturday). The openings have not been posted yet. I suggested he call back in an hour and hopefully they will be up at that time.  I called the backline and spoke with [place] who states that they are normally put up after noon time. She was going to check with the scheduler to see if she can find out what time they will be posted. She came back to say that they have been scheduling already. It should be up. I checked again and it is not on the website. I spoke with Rachel Bo who advised me to use Dr. Elease Hashimoto and Dr. Nonah Mattes to search with. Unable to find a Dr. Nonah Mattes. I was able to find shcedules for Dr. Carolann Littler.  Appointment scheduled for 9:15am with Dr. Carolann Littler on  Saturday 28th.   Referrals  REFERRED TO PCP OFFICE   Disagree/Comply: Comply

## 2016-01-07 ENCOUNTER — Ambulatory Visit (INDEPENDENT_AMBULATORY_CARE_PROVIDER_SITE_OTHER): Payer: Managed Care, Other (non HMO) | Admitting: Family Medicine

## 2016-01-07 ENCOUNTER — Encounter: Payer: Self-pay | Admitting: Family Medicine

## 2016-01-07 VITALS — BP 132/86 | HR 68 | Temp 97.7°F | Ht 69.0 in | Wt 291.0 lb

## 2016-01-07 DIAGNOSIS — S40021A Contusion of right upper arm, initial encounter: Secondary | ICD-10-CM | POA: Diagnosis not present

## 2016-01-07 DIAGNOSIS — M79601 Pain in right arm: Secondary | ICD-10-CM | POA: Diagnosis not present

## 2016-01-07 DIAGNOSIS — L03113 Cellulitis of right upper limb: Secondary | ICD-10-CM | POA: Diagnosis not present

## 2016-01-07 MED ORDER — CEPHALEXIN 500 MG PO CAPS: 500.0000 mg | ORAL_CAPSULE | Freq: Four times a day (QID) | ORAL | Status: DC

## 2016-01-07 NOTE — Progress Notes (Signed)
Pre visit review using our clinic review tool, if applicable. No additional management support is needed unless otherwise documented below in the visit note. 

## 2016-01-07 NOTE — Patient Instructions (Signed)
Elevate right arm frequently Use heating pad to arm several times daily Follow up with your primary on Monday-may need to be x-rayed then.

## 2016-01-07 NOTE — Progress Notes (Signed)
   Subjective:    Patient ID: Collin Murphy, male    DOB: December 05, 1966, 50 y.o.   MRN: [id number]  HPI   Patient seen Saturday clinic with right elbow pain and swelling. This past Monday states he slipped and fell onto concrete. He landed on his right arm. He had some mild pain initially but over the week developed some slight redness and warmth and increased tenderness-distal arm and elbow. He has not had any fever or chills. He has been applying ice several times daily. Denies any shoulder pain. Full range of motion elbow.  No wrist or neck pain. No head injury.  He has had some mild swelling.  Past Medical History  Diagnosis Date  . ALLERGIC RHINITIS 09/03/2007  . GERD 09/03/2007  . HYPERCHOLESTEROLEMIA 08/26/2009  . HYPERSOMNIA 08/26/2009  . OBSTRUCTIVE SLEEP APNEA 09/21/2009  . TINEA PEDIS 09/03/2007  . Prostatitis     multiple episodes   Past Surgical History  Procedure Laterality Date  . 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 He has never used smokeless tobacco. He reports that he drinks alcohol. He reports that he does not use illicit drugs. family history includes COPD in his brother; Diabetes in his father; Gout in his brother; Heart disease in his mother; Hypertension in his mother. There is no history of Cancer. No Known Allergies    Review of Systems  Constitutional: Negative for fever and chills.  Respiratory: Negative for shortness of breath.   Gastrointestinal: Negative for nausea and vomiting.  Musculoskeletal: Negative for back pain.  Neurological: Negative for dizziness, weakness, numbness and headaches.       Objective:   Physical Exam  Constitutional: He appears well-developed and well-nourished.  Musculoskeletal:  Right elbow full range of motion. He has some mild tenderness over the olecranon bursa region but minimal bursal swelling. He has some diffuse bruising which extends from just  distal to the right elbow to about 8 cm proximal and also he has some slight warmth in this region with mild erythema. No visible abrasions. No radial head tenderness. No pain with supination and pronation Has some mild pain with triceps extension against resistance.  Neurological: He is alert.           Assessment & Plan:  Right elbow pain.  Recent fall as above. NO specific bony tenderness. We explained our preference for X-ray with hx of fall but pt declined.  He definitely has some bruising but also concern for some cellulitis changes. Avoid further ice. Elevate frequently Keflex 500 mg po qid and follow up with primary on Monday.

## 2016-01-12 ENCOUNTER — Ambulatory Visit (INDEPENDENT_AMBULATORY_CARE_PROVIDER_SITE_OTHER): Payer: Managed Care, Other (non HMO) | Admitting: Family

## 2016-01-12 ENCOUNTER — Encounter: Payer: Self-pay | Admitting: Family

## 2016-01-12 VITALS — BP 106/72 | HR 71 | Temp 98.0°F | Resp 18 | Ht 69.0 in | Wt 291.0 lb

## 2016-01-12 DIAGNOSIS — S40021D Contusion of right upper arm, subsequent encounter: Secondary | ICD-10-CM | POA: Diagnosis not present

## 2016-01-12 DIAGNOSIS — S40029A Contusion of unspecified upper arm, initial encounter: Secondary | ICD-10-CM | POA: Insufficient documentation

## 2016-01-12 NOTE — Progress Notes (Signed)
Pre visit review using our clinic review tool, if applicable. No additional management support is needed unless otherwise documented below in the visit note. 

## 2016-01-12 NOTE — Progress Notes (Signed)
   Subjective:    Patient ID: Collin Murphy, male    DOB: 02-Oct-1966, 50 y.o.   MRN: [id number]  Chief Complaint  Patient presents with  . Follow-up    Right elbow  still has some tenderness and some pain, did have a fall and landed on that elbow    HPI:  Collin Murphy is a 50 y.o. male who  has a past medical history of ALLERGIC RHINITIS (09/03/2007); GERD (09/03/2007); HYPERCHOLESTEROLEMIA (08/26/2009); HYPERSOMNIA (08/26/2009); OBSTRUCTIVE SLEEP APNEA (09/21/2009); TINEA PEDIS (09/03/2007); and Prostatitis. and presents today for a follow up office visit.   Recently evaluated in the office following a fall which he landed on his right elbow. He was diagnosed with right elbow pain and prescribed Keflex. Since starting the medication he has had some improvements. Takes the medication as prescribed and denies adverse side effects. Modifying factors include ice/heat. Denies numbness, weakness, or tingling.   No Known Allergies  Outpatient Prescriptions Prior to Visit  Medication Sig Dispense Refill  . cephALEXin (KEFLEX) 500 MG capsule Take 1 capsule (500 mg total) by mouth 4 (four) times daily. 40 capsule 0  . Fluticasone-Salmeterol (ADVAIR DISKUS) 100-50 MCG/DOSE AEPB Inhale 1 puff into the lungs 2 (two) times daily. 1 each 2   No facility-administered medications prior to visit.     Review of Systems  Constitutional: Negative for fever and chills.  Musculoskeletal:       Positive for right tricep pain.  Neurological: Negative for weakness and numbness.      Objective:    BP 106/72 mmHg  Pulse 71  Temp(Src) 98 F (36.7 C) (Oral)  Resp 18  Ht 5\' 9"  (1.753 m)  Wt 291 lb (131.997 kg)  BMI 42.95 kg/m2  SpO2 94% Nursing note and vital signs reviewed.  Physical Exam  Constitutional: He is oriented to person, place, and time. He appears well-developed and well-nourished. No distress.  Cardiovascular: Normal rate, regular rhythm, normal heart sounds and intact distal  pulses.   Pulmonary/Chest: Effort normal and breath sounds normal.  Musculoskeletal:  Right elbow / Upper arm - Mild edema with no obvious deformity or discoloration noted. Tenderness of mid tricep which is increased with AROM. Elbow range of motion normal in flexion and extension. Strength is 4-5+/5. Distal pulses, sensation and reflexes are intact and appropriate.  Neurological: He is alert and oriented to person, place, and time.  Skin: Skin is warm and dry.  Psychiatric: He has a normal mood and affect. His behavior is normal. Judgment and thought content normal.       Assessment & Plan:   Problem List Items Addressed This Visit      Other   Contusion, arm, upper - Primary    Symptoms and exam consistent with tricep contusion although cannot fully rule out infection although seems unlikely. Continue OTC medications as needed for symptom relief and supportive care. Complete course of Keflex. Home exercise therapy. Follow up if symptoms worsen or do not continue to improve.

## 2016-01-12 NOTE — Patient Instructions (Signed)
Thank you for choosing [facility].  Summary/Instructions:  If your symptoms worsen or fail to improve, please contact our office for further instruction, or in case of emergency go directly to the emergency room at the closest medical facility.   Triceps Tendinitis with Rehab Triceps tendinitis usually results in a ligament sprain (tear). The triceps tendon attaches the elbow to the triceps muscle on the back of the arm. It prevents the elbow from bending too far outward. Sprains are classified into three categories. Grade 1 sprains cause pain, but the tendon is not lengthened. Grade 2 sprains include a lengthened ligament due to the ligament being stretched or partially ruptured. With grade 2 sprains there is still function, although the function may be diminished. Grade 3 sprains are characterized by a complete tear of the tendon or muscle and function is usually impaired. SYMPTOMS   Pain, tenderness, swelling, and/or bruising over the site of injury (contusion).  Pain that worsens with elbow movement, such as push-ups.  A "pop" or tear felt or heard at the time of injury.  Decreased elbow function and/or grip strength. CAUSES   Overuse of triceps muscles and tendons.  Injury, laceration or direct blow to the triceps tendon. RISK INCREASES WITH:  Activities in which falling is likely.  Weightlifting and push-ups.  Poor strength and flexibility.  Steroid use. PREVENTION  Warm up and stretch properly before activity.  Maintain physical fitness:  Strength, flexibility, and endurance.  Cardiovascular fitness.  Learn and use proper technique. When possible, have coach correct improper technique.  Functional braces may be effective in preventing injury, especially re-injury, in contact sports. PROGNOSIS  Triceps sprains usually heal in 6 weeks with proper treatment and rest. RELATED COMPLICATIONS  Tendon rupture requiring surgery.  Loss of motion in the  elbow  Prolonged healing time, if improperly treated or re-injured. TREATMENT  Treatment initially involves resting from any activities that aggravate the symptoms, and the use of ice and medications to help reduce pain and inflammation. Referral to a therapist for further evaluation and treatment may be enough for a full recovery. If rehabilitation alone is insufficient for resolving the injury, then surgery may be necessary to use other tissue to recreate (reconstruct) the torn tendon. After surgery, immobilization of the elbow is necessary to allow for healing. After immobilization it is important to perform strengthening and stretching exercises to help regain strength and a full range of motion. These exercises may be completed at home or with a therapist. Your therapist will decide when you may return to sports. MEDICATION   If pain medication is necessary, then nonsteroidal anti-inflammatory medications, such as aspirin and ibuprofen, or other minor pain relievers, such as acetaminophen, are often recommended.  Do not take pain medication for 7 days before surgery.  Prescription pain relievers may be given if deemed necessary by your caregiver. Use only as directed and only as much as you need. HEAT AND COLD  Cold treatment (icing) relieves pain and reduces inflammation. Cold treatment should be applied for 10 to 15 minutes every 2 to 3 hours for inflammation and pain and immediately after any activity that aggravates your symptoms. Use ice packs or massage the area with a piece of ice (ice massage).  Heat treatment may be used prior to performing the stretching and strengthening activities prescribed by your caregiver, physical therapist, or athletic trainer. Use a heat pack or soak your injury in warm water. SEEK MEDICAL CARE IF:  Treatment seems to offer no benefit, or  the condition worsens.  Any medications produce adverse side effects.  Any complications from surgery  occur:  Pain, numbness, or coldness in the extremity operated upon.  Discoloration of the nail beds (they become blue or gray) of the extremity operated upon.  Signs of infections (fever, pain, inflammation, redness, or persistent bleeding). EXERCISES RANGE OF MOTION (ROM) AND STRETCHING EXERCISES - Triceps Tendinitis These exercises may help you when beginning to rehabilitate your injury. Your symptoms may resolve with or without further involvement from your physician, physical therapist or athletic trainer. While completing these exercises, remember:   Restoring tissue flexibility helps normal motion to return to the joints. This allows healthier, less painful movement and activity.  An effective stretch should be held for at least 30 seconds.  A stretch should never be painful. You should only feel a gentle lengthening or release in the stretched tissue. RANGE OF MOTION - Flexion  Hold your right / left arm at your side and bend your elbow as far as you can using your right / left arm muscles.  Bend the right / left elbow farther by gently pushing up on your forearm until you feel a gentle stretch on the outside of your elbow. Hold this position for __________ seconds.  Slowly return to the starting position. Repeat __________ times. Complete this exercise __________ times per day.  RANGE OF MOTION - Elbow Flexion, Supine   Lie on your back. Extend your right / left arm into the air, bracing it with your opposite hand. Allow your right / left arm to relax.  Let your elbow bend, allowing your hand fall slowly toward your chest.  You should feel a gentle stretch along the back of your upper arm and/or elbow. Your physician, physical therapist or athletic trainer may ask you to hold a __________ hand weight to increase the intensity of this stretch.  Hold for __________ seconds. Slowly return your right / left arm to the upright position. Repeat __________ times. Complete this  exercise __________ times per day. STRETCH - Elbow Flexors   Lie on a firm bed or countertop on your back. Be sure that you are in a comfortable position which will allow you to relax your arm muscles.  Place a folded towel under your upper arm so that your elbow and shoulder are at the same height. Extend your arm; your elbow should not rest on the bed or towel  Allow the weight of your hand to straighten your elbow. Keep your arm and chest muscles relaxed. Your caretaker may ask you to increase the intensity of your stretch by adding a small wrist or hand weight.  Hold for __________ seconds. You should feel a stretch on the inside of your elbow. Slowly return to the starting position. Repeat __________ times. Complete this exercise __________ times per day. STRENGTHENING EXERCISES - Triceps Tendinitis These exercises will help you regain your strength. These exercises may resolve your symptoms with or without further involvement from your physician, physical therapist or athletic trainer. While completing these exercises, remember:   Muscles can gain both the endurance and the strength needed for everyday activities through controlled exercises.  Complete these exercises as instructed by your physician, physical therapist or athletic trainer. Progress with the resistance and repetition exercises only as your caregiver advises.  You may experience muscle soreness or fatigue, but the pain or discomfort you are trying to eliminate should never worsen during these exercises. If this pain does worsen, stop and make certain you  are following the directions exactly. If the pain is still present after adjustments, discontinue the exercise until you can discuss the trouble with your clinician. STRENGTH - Elbow Extensors, Isometric  Stand or sit upright on a firm surface. Place your right / left arm so that your palm faces your abdomen and it is at the height of your waist.  Place your opposite hand  on the underside of your forearm. Gently push up as your right / left arm resists. Push as hard as you can with both arms without causing any pain or movement at your right / left elbow. Hold this stationary position for __________ seconds.  Gradually release the tension in both arms. Allow your muscles to relax completely before repeating. Repeat __________ times. Complete this exercise __________ times per day. STRENGTH - Elbow Flexors, Supinated  With good posture, stand or sit on a firm chair without armrests. Allow your right / left arm to rest at your side with your palm facing forward.  Holding a __________ weight or gripping a rubber exercise band/tubing, bring your hand toward your shoulder.  Allow your muscles to control the resistance as your hand returns to your side. Repeat __________ times. Complete this exercise __________ times per day.  STRENGTH - Elbow Flexors, Neutral  With good posture, stand or sit on a firm chair without armrests. Allow your right / left arm to rest at your side with your thumb facing forward.  Holding a __________ weight or gripping a rubber exercise band/tubing, bring your hand toward your shoulder.  Allow your muscles to control the resistance as your hand returns to your side. Repeat __________ times. Complete this exercise __________ times per day.  STRENGTH - Elbow Extensors  Lie on your back. Extend your right / left elbow into the air, pointing it toward the ceiling. Brace your arm with your opposite hand.*  Holding a __________ weight in your hand, slowly straighten your right / left elbow.  Allow your muscles to control the weight as your hand returns to its starting position. Repeat __________ times. Complete this exercise __________ times per day. *You may also stand with your elbow overhead and pointed toward the ceiling and supported by your opposite hand. STRENGTH - Elbow Extensors, Dynamic  With good posture, stand or sit on a firm  chair without armrests. Keeping your upper arms at your side, bring both hands up to your right / left shoulder while gripping a rubber exercise band/tubing. Your right / left hand should be just below the other hand.  Straighten your right / left elbow. Hold for __________ seconds.  Allow your muscles to control the rubber exercise band/tubing as your hand returns to your shoulder. Repeat __________ times. Complete this exercise __________ times per day.   This information is not intended to replace advice given to you by your health care provider. Make sure you discuss any questions you have with your health care provider.   Document Released: 11/26/2005 Document Revised: 02/18/2012 Document Reviewed: 03/10/2009 Elsevier Interactive Patient Education [employer].

## 2016-01-12 NOTE — Assessment & Plan Note (Signed)
Symptoms and exam consistent with tricep contusion although cannot fully rule out infection although seems unlikely. Continue OTC medications as needed for symptom relief and supportive care. Complete course of Keflex. Home exercise therapy. Follow up if symptoms worsen or do not continue to improve.

## 2016-06-28 ENCOUNTER — Encounter: Payer: Self-pay | Admitting: Family

## 2016-06-28 ENCOUNTER — Ambulatory Visit (INDEPENDENT_AMBULATORY_CARE_PROVIDER_SITE_OTHER): Payer: Managed Care, Other (non HMO) | Admitting: Family

## 2016-06-28 VITALS — BP 112/76 | HR 60 | Temp 98.5°F | Ht 69.0 in | Wt 290.0 lb

## 2016-06-28 DIAGNOSIS — R600 Localized edema: Secondary | ICD-10-CM | POA: Diagnosis not present

## 2016-06-28 DIAGNOSIS — G4733 Obstructive sleep apnea (adult) (pediatric): Secondary | ICD-10-CM

## 2016-06-28 MED ORDER — HYDROCHLOROTHIAZIDE 12.5 MG PO TABS: 12.5000 mg | ORAL_TABLET | Freq: Every day | ORAL | Status: DC

## 2016-06-28 NOTE — Progress Notes (Signed)
Pre visit review using our clinic review tool, if applicable. No additional management support is needed unless otherwise documented below in the visit note. 

## 2016-06-28 NOTE — Progress Notes (Addendum)
Subjective:    Patient ID: Collin Murphy, male    DOB: [birth year], 50 y.o.   MRN: [id number]  Chief Complaint  Patient presents with  . Follow-up    3-4 month    HPI:  Collin Murphy is a 50 y.o. male who  has a past medical history of ALLERGIC RHINITIS (09/03/2007); GERD (09/03/2007); HYPERCHOLESTEROLEMIA (08/26/2009); HYPERSOMNIA (08/26/2009); OBSTRUCTIVE SLEEP APNEA (09/21/2009); TINEA PEDIS (09/03/2007); and Prostatitis. and presents today for a follow up office visit.  1.) Lower extremity edema - This is a chronic condition. Associated symptoms of pain and edema located in his bilateral legs have been going on for years. Pains originally started in the left and have now started in the right. 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 Symptoms are improved when he wakes up in morning. Severity causes him to have to sit for periods of time throughout the day. Previously diagnosed with sleep apnea and has recently purchased a CPAP not currently using it secondary to setting it up.   No Known Allergies   No current outpatient prescriptions on file prior to visit.   No current facility-administered medications on file prior to visit.    Past Medical History  Diagnosis Date  . ALLERGIC RHINITIS 09/03/2007  . GERD 09/03/2007  . HYPERCHOLESTEROLEMIA 08/26/2009  . HYPERSOMNIA 08/26/2009  . OBSTRUCTIVE SLEEP APNEA 09/21/2009  . TINEA PEDIS 09/03/2007  . Prostatitis     multiple episodes     Review of Systems  Constitutional: Negative for fever and chills.  Respiratory: Negative for cough, chest tightness and shortness of breath.   Cardiovascular: Positive for leg swelling. Negative for chest pain and palpitations.  Gastrointestinal: Negative for abdominal pain.  Genitourinary: Negative for hematuria and flank pain.      Objective:    BP 112/76 mmHg  Pulse 60  Temp(Src) 98.5 F (36.9 C) (Oral)  Ht 5\' 9"  (1.753 m)   Wt 290 lb (131.543 kg)  BMI 42.81 kg/m2  SpO2 93% Nursing note and vital signs reviewed.  Physical Exam  Constitutional: He is oriented to person, place, and time. He appears well-developed and well-nourished. No distress.  Cardiovascular: Normal rate, regular rhythm, normal heart sounds and intact distal pulses.   Moderate amount of nonpitting edema located in his bilateral lower extremities  Pulmonary/Chest: Effort normal and breath sounds normal.  Musculoskeletal:  No calf pain or tenderness; negative Homans sign  Neurological: He is alert and oriented to person, place, and time.  Skin: Skin is warm and dry.  Psychiatric: He has a normal mood and affect. His behavior is normal. Judgment and thought content normal.       Assessment & Plan:   Problem List Items Addressed This Visit      Respiratory   Obstructive sleep apnea    Obstructive sleep apnea with possible need for sleep study and need for assistance with his CPAP at home. She continues to have significant daytime sleepiness and increased fatigue. Because he has supplies of machine that he purchased. Referral to pulmonology placed for possible sleep study and CPAP titration.        Other   Bilateral lower extremity edema - Primary    Bilateral lower extremity edema is most likely multifactorial with contributing factors including sodium intake, sleep apnea, and obesity. Discussed importance of decreasing sodium in his nutritional intake and looking for hidden sodium and processed foods and fast foods.  Elevate legs when able and tolerated. Compression stockings as needed. Referral to pulmonology placed for sleep apnea treatment. Start hydrochlorothiazide as needed for edema. Follow-up in one month or sooner if needed.      Relevant Medications   hydrochlorothiazide (HYDRODIURIL) 12.5 MG tablet       I have discontinued Mr. Cordle cephALEXin. I am also having him start on hydrochlorothiazide.   Meds ordered this  encounter  Medications  . hydrochlorothiazide (HYDRODIURIL) 12.5 MG tablet    Sig: Take 1-2 tablets (12.5-25 mg total) by mouth daily.    Dispense:  30 tablet    Refill:  1    Order Specific Question:  Supervising Provider    Answer:  Pricilla Holm A [id number]     Follow-up: Return if symptoms worsen or fail to improve.  Mauricio Po, FNP

## 2016-06-28 NOTE — Patient Instructions (Addendum)
Thank you for choosing [facility].  Summary/Instructions:  They will with your appointment for sleep medicine.  Take the hydrochlorothiazide as needed for edema.  Drink plenty of water.   Elevate feet as able.  Decrease sodium in diet.   Your prescription(s) have been submitted to your pharmacy or been printed and provided for you. Please take as directed and contact our office if you believe you are having problem(s) with the medication(s) or have any questions.  If your symptoms worsen or fail to improve, please contact our office for further instruction, or in case of emergency go directly to the emergency room at the closest medical facility.

## 2016-06-28 NOTE — Assessment & Plan Note (Signed)
Bilateral lower extremity edema is most likely multifactorial with contributing factors including sodium intake, sleep apnea, and obesity. Discussed importance of decreasing sodium in his nutritional intake and looking for hidden sodium and processed foods and fast foods. Elevate legs when able and tolerated. Compression stockings as needed. Referral to pulmonology placed for sleep apnea treatment. Start hydrochlorothiazide as needed for edema. Follow-up in one month or sooner if needed.

## 2016-06-28 NOTE — Assessment & Plan Note (Signed)
Obstructive sleep apnea with possible need for sleep study and need for assistance with his CPAP at home. She continues to have significant daytime sleepiness and increased fatigue. Because he has supplies of machine that he purchased. Referral to pulmonology placed for possible sleep study and CPAP titration.

## 2016-07-24 ENCOUNTER — Telehealth: Payer: Self-pay | Admitting: Family

## 2016-07-24 DIAGNOSIS — R0683 Snoring: Secondary | ICD-10-CM

## 2016-07-24 NOTE — Telephone Encounter (Signed)
Patient states that he was suppose to be referred a month ago for a sleep study and has not heard anything.  I do not see a referral in system.  Please follow up in regard.

## 2016-07-25 NOTE — Telephone Encounter (Signed)
Referral has been placed. 

## 2016-09-12 ENCOUNTER — Institutional Professional Consult (permissible substitution): Payer: Managed Care, Other (non HMO) | Admitting: Pulmonary Disease

## 2016-10-03 ENCOUNTER — Encounter: Payer: Self-pay | Admitting: Pulmonary Disease

## 2016-10-03 ENCOUNTER — Ambulatory Visit (INDEPENDENT_AMBULATORY_CARE_PROVIDER_SITE_OTHER): Payer: Managed Care, Other (non HMO) | Admitting: Pulmonary Disease

## 2016-10-03 ENCOUNTER — Institutional Professional Consult (permissible substitution): Payer: Managed Care, Other (non HMO) | Admitting: Pulmonary Disease

## 2016-10-03 VITALS — BP 122/72 | HR 75 | Ht 69.5 in | Wt 290.2 lb

## 2016-10-03 DIAGNOSIS — G4733 Obstructive sleep apnea (adult) (pediatric): Secondary | ICD-10-CM | POA: Diagnosis not present

## 2016-10-03 NOTE — Patient Instructions (Signed)
Call us with the pressure setting on your machine CPAP titration study-based on this, we  will make changes to the pressure setting

## 2016-10-03 NOTE — Progress Notes (Signed)
   Subjective:    Patient ID: Collin Murphy, male    DOB: [birth year], 50 y.o.   MRN: [id number]  HPI    Review of Systems  Constitutional: Negative for fever and unexpected weight change.  HENT: Positive for ear pain. Negative for congestion, dental problem, nosebleeds, postnasal drip, rhinorrhea, sinus pressure, sneezing, sore throat and trouble swallowing.   Eyes: Positive for redness. Negative for itching.  Respiratory: Positive for wheezing. Negative for cough, chest tightness and shortness of breath.   Cardiovascular: Positive for leg swelling. Negative for palpitations.  Gastrointestinal: Negative for nausea and vomiting.  Genitourinary: Negative for dysuria.  Musculoskeletal: Negative for joint swelling.  Skin: Negative for rash.  Neurological: Negative for headaches.  Hematological: Does not bruise/bleed easily.  Psychiatric/Behavioral: Negative for dysphoric mood. The patient is not nervous/anxious.        Objective:   Physical Exam        Assessment & Plan:

## 2016-10-03 NOTE — Assessment & Plan Note (Addendum)
Call us with the pressure setting on your machine, he can restart at a pressure between 10-14 cm CPAP titration study-based on this, we  will make changes to the pressure setting  Weight loss encouraged, compliance with goal of at least 4-6 hrs every night is the expectation. Advised against medications with sedative side effects Cautioned against driving when sleepy - understanding that sleepiness will vary on a day to day basis

## 2016-10-03 NOTE — Progress Notes (Signed)
Patient ID: Collin Murphy, male   DOB: [birth year], 50 y.o.   MRN: [id number]

## 2016-10-03 NOTE — Progress Notes (Addendum)
Subjective:    Patient ID: Collin Murphy, male    DOB: January 24, 1966, 50 y.o.   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 He underwent an overnight polysomnogram in 09/2009 which showed an AHI of 22/hour with a Nadir desaturation of 81%. Based on this he was placed on a CPAP machine, then changed to auto CPAP and optimal pressure of 14 cm was recorded. He was poorly compliant with his machine and this was taken away.  Over the past few months he reports increased snoring and somnolence during the daytime. He reports nodding off at work, as a passenger in a car or in social situations such as at church or in a meeting. He has even fallen asleep while talking to his wife on one occasion. Epworth sleepiness score is 16 Bedtime is as early as 10 PM, he sleeps on his side with one pillow, sleep latency is minimal, reports 2-3 nocturnal awakenings including an occasional bathroom visit is out of bed by 7:30 AM feeling tired with occasional dryness of mouth but denies headaches. He keeps a glass of water by his bedside.  He obtained a new CPAP machine over the Internet, as obtained a full face mask from [facility] and is willing to try CPAP again.     Past Medical History:  Diagnosis Date  . ALLERGIC RHINITIS 09/03/2007  . GERD 09/03/2007  . HYPERCHOLESTEROLEMIA 08/26/2009  . HYPERSOMNIA 08/26/2009  . OBSTRUCTIVE SLEEP APNEA 09/21/2009  . Prostatitis    multiple episodes  . TINEA PEDIS 09/03/2007     Past Surgical History:  Procedure Laterality Date  . TOE AMPUTATION     3rd and 4th toes amputated after forklift injury    No Known Allergies   Social History   Social History  . Marital status: Married    Spouse name: N/A  . Number of children: 2  . Years of education: 16   Occupational History  . Mental health and Professional barber [employer]   Social History Main Topics  . Smoking status: Never Smoker  . Smokeless tobacco:  Never Used  . Alcohol use Yes     Comment: occ/rare  . Drug use: No  . Sexual activity: Yes    Partners: Female   Other Topics Concern  . Not on file   Social History Narrative   HS[employer]. [employer] sociology. Completed barber's college. Married-1990   Children : 1 boy '93, 1 girl '98. Work Engineer, mining.              Family History  Problem Relation Age of Onset  . Hypertension Mother   . Heart disease Mother     CAD  . Diabetes Father   . Gout Brother   . COPD Brother   . Cancer Neg Hx     No history Colon, Prostate Breast CA     Review of Systems neg for any significant sore throat, dysphagia, itching, sneezing, nasal congestion or excess/ purulent secretions, fever, chills, sweats, unintended wt loss, pleuritic or exertional cp, hempoptysis, orthopnea pnd or change in chronic leg swelling.   Also denies presyncope, palpitations, heartburn, abdominal pain, nausea, vomiting, diarrhea or change in bowel or urinary habits, dysuria,hematuria, rash, arthralgias, visual complaints, headache, numbness weakness or ataxia.     Objective:   Physical Exam   Gen. Pleasant, obese, in no distress, normal affect ENT - no lesions, no post nasal drip, class 2-3 airway Neck: No JVD, no  thyromegaly, no carotid bruits Lungs: no use of accessory muscles, no dullness to percussion, decreased without rales or rhonchi  Cardiovascular: Rhythm regular, heart sounds  normal, no murmurs or gallops, no peripheral edema Abdomen: soft and non-tender, no hepatosplenomegaly, BS normal. Musculoskeletal: No deformities, no cyanosis or clubbing Neuro:  alert, non focal, no tremors        Assessment & Plan:

## 2016-10-04 ENCOUNTER — Other Ambulatory Visit: Payer: Self-pay

## 2016-10-04 ENCOUNTER — Telehealth: Payer: Self-pay | Admitting: Pulmonary Disease

## 2016-10-04 DIAGNOSIS — G4733 Obstructive sleep apnea (adult) (pediatric): Secondary | ICD-10-CM

## 2016-10-04 NOTE — Telephone Encounter (Signed)
Called and spoke to pt. Pt states when he turned his machine on he noted the number stating HC234, 2.0. Advised pt that this is not the pressure settings. Pt states he will try again and call back. Will await call.

## 2016-10-10 NOTE — Telephone Encounter (Signed)
Called and spoke with pt and he stated that his cpap says that it is set at 14.0 cm h2o.  He stated that he is not really sure how to read it.  Do we need to adjust anything?  Please advise.

## 2016-10-12 NOTE — Telephone Encounter (Signed)
That is correct pressure settings for now Await CPAP titration study-based on this, we  will make changes to the pressure setting

## 2016-10-18 ENCOUNTER — Telehealth: Payer: Self-pay

## 2016-10-18 NOTE — Telephone Encounter (Signed)
Pt wife aware of 10-04-16 phone note. Tami voiced understanding and had no further questions.  Nothing further needed.

## 2016-11-21 ENCOUNTER — Encounter (HOSPITAL_BASED_OUTPATIENT_CLINIC_OR_DEPARTMENT_OTHER): Payer: Managed Care, Other (non HMO)

## 2016-11-21 ENCOUNTER — Ambulatory Visit (HOSPITAL_BASED_OUTPATIENT_CLINIC_OR_DEPARTMENT_OTHER): Payer: Managed Care, Other (non HMO) | Attending: Pulmonary Disease | Admitting: Pulmonary Disease

## 2016-11-21 VITALS — Ht 69.0 in | Wt 287.0 lb

## 2016-11-21 DIAGNOSIS — R5383 Other fatigue: Secondary | ICD-10-CM | POA: Insufficient documentation

## 2016-11-21 DIAGNOSIS — G4736 Sleep related hypoventilation in conditions classified elsewhere: Secondary | ICD-10-CM | POA: Insufficient documentation

## 2016-11-21 DIAGNOSIS — E669 Obesity, unspecified: Secondary | ICD-10-CM | POA: Insufficient documentation

## 2016-11-21 DIAGNOSIS — I472 Ventricular tachycardia: Secondary | ICD-10-CM | POA: Diagnosis not present

## 2016-11-21 DIAGNOSIS — G4733 Obstructive sleep apnea (adult) (pediatric): Secondary | ICD-10-CM | POA: Insufficient documentation

## 2016-11-26 DIAGNOSIS — G4730 Sleep apnea, unspecified: Secondary | ICD-10-CM | POA: Diagnosis not present

## 2016-11-27 ENCOUNTER — Telehealth: Payer: Self-pay | Admitting: Pulmonary Disease

## 2016-11-27 DIAGNOSIS — G4733 Obstructive sleep apnea (adult) (pediatric): Secondary | ICD-10-CM

## 2016-11-27 NOTE — Procedures (Signed)
Patient Name: Collin Murphy, Collin Murphy Date: 11/21/2016 Gender: Male D.O.B: [birth year] Age (years): 35 Referring Provider: Kara Mead MD, ABSM Height (inches): 69 Interpreting Physician: Kara Mead MD, ABSM Weight (lbs): 287 RPSGT: Laren Everts BMI: 42 MRN: [id number] Neck Size: 19.00  CLINICAL INFORMATION Sleep Study Type: NPSG    Indication for sleep study: Excessive Daytime Sleepiness, Fatigue, Obesity, OSA, Re-Evaluation, Snoring, Witnessed Apneas  Epworth Sleepiness Score: 21  polysomnogram dated 09/06/2009 revealed an AHI of 22.3/h and RDI of 33.2/h.  SLEEP STUDY TECHNIQUE As per the AASM Manual for the Scoring of Sleep and Associated Events v2.3 (April 2016) with a hypopnea requiring 4% desaturations.  The channels recorded and monitored were frontal, central and occipital EEG, electrooculogram (EOG), submentalis EMG (chin), nasal and oral airflow, thoracic and abdominal wall motion, anterior tibialis EMG, snore microphone, electrocardiogram, and pulse oximetry.  MEDICATIONS Medications self-administered by patient taken the night of the study : N/A  SLEEP ARCHITECTURE The study was initiated at 10:18:19 PM and ended at 4:39:13 AM.  Sleep onset time was 7.7 minutes and the sleep efficiency was 70.0%. The total sleep time was 266.5 minutes.  Stage REM latency was 220.5 minutes.  The patient spent 14.63% of the night in stage N1 sleep, 72.61% in stage N2 sleep, 0.00% in stage N3 and 12.76% in REM.  Alpha intrusion was absent.  Supine sleep was 0.00%.  RESPIRATORY PARAMETERS The overall apnea/hypopnea index (AHI) was 21.4 per hour. There were 37 total apneas, including 37 obstructive, 0 central and 0 mixed apneas. There were 58 hypopneas and 28 RERAs.  The AHI during Stage REM sleep was 60.0 per hour.  AHI while supine was N/A per hour.  The mean oxygen saturation was 94.39%. The minimum SpO2 during sleep was 78.00%.  Moderate snoring was noted during this  study.  CARDIAC DATA The 2 lead EKG demonstrated sinus rhythm. The mean heart rate was 55.69 beats per minute. Other EKG findings include: Ventricular Tachycardia.   LEG MOVEMENT DATA The total PLMS were 1 with a resulting PLMS index of 0.23. Associated arousal with leg movement index was 0.2 .  IMPRESSIONS - Moderate obstructive sleep apnea occurred during this study (AHI = 21.4/h). - No significant central sleep apnea occurred during this study (CAI = 0.0/h). - Moderate oxygen desaturation was noted during this study (Min O2 = 78.00%). - The patient snored with Moderate snoring volume. - EKG findings include Ventricular Tachycardia. - Clinically significant periodic limb movements did not occur during sleep. No significant associated arousals.   DIAGNOSIS - Obstructive Sleep Apnea (327.23 [G47.33 ICD-10]) - Nocturnal Hypoxemia (327.26 [G47.36 ICD-10])   RECOMMENDATIONS - Therapeutic CPAP titration to determine optimal pressure required to alleviate sleep disordered breathing. - Avoid alcohol, sedatives and other CNS depressants that may worsen sleep apnea and disrupt normal sleep architecture. - Sleep hygiene should be reviewed to assess factors that may improve sleep quality. - Weight management and regular exercise should be initiated or continued if appropriate.    Kara Mead MD Board Certified in [place]

## 2016-11-27 NOTE — Telephone Encounter (Signed)
PSG confirmed mod OSA - AHI 21/h His CPAP is set at 14 cm Obtain download on machine & decide about settings (Alternatively he will need repeat CPAP titration study )

## 2016-11-30 NOTE — Telephone Encounter (Signed)
No, please ask him to get back on his CPAP machine and we will review download

## 2016-11-30 NOTE — Telephone Encounter (Signed)
Spoke with pt. He is aware of results of sleep study. States that he is not currently using his CPAP machine.  RA - do you want to send him for a CPAP titration?

## 2016-12-04 NOTE — Telephone Encounter (Signed)
Spoke with pt. He is aware that we will be sending this order to a DME. Order has been placed. Nothing further was needed.

## 2016-12-04 NOTE — Telephone Encounter (Signed)
Called and spoke with pt and he stated that he does not have a home care company that he has used in the past.  He stated in order to use the cpap, he will need this set at the correct setting.  RA please advise.  thanks

## 2016-12-04 NOTE — Telephone Encounter (Signed)
Pl set him up with DME  Set at 14 cm & obtain download in 2 weeks

## 2017-04-02 ENCOUNTER — Ambulatory Visit (INDEPENDENT_AMBULATORY_CARE_PROVIDER_SITE_OTHER): Payer: 59 | Admitting: Nurse Practitioner

## 2017-04-02 ENCOUNTER — Encounter: Payer: Self-pay | Admitting: Nurse Practitioner

## 2017-04-02 VITALS — BP 122/82 | HR 60 | Temp 98.2°F | Ht 69.5 in | Wt 296.0 lb

## 2017-04-02 DIAGNOSIS — H8112 Benign paroxysmal vertigo, left ear: Secondary | ICD-10-CM

## 2017-04-02 DIAGNOSIS — J309 Allergic rhinitis, unspecified: Secondary | ICD-10-CM

## 2017-04-02 DIAGNOSIS — H6983 Other specified disorders of Eustachian tube, bilateral: Secondary | ICD-10-CM

## 2017-04-02 MED ORDER — PREDNISONE 10 MG (21) PO TBPK: ORAL_TABLET | ORAL | 0 refills | Status: DC

## 2017-04-02 MED ORDER — MECLIZINE HCL 12.5 MG PO TABS: 12.5000 mg | ORAL_TABLET | Freq: Two times a day (BID) | ORAL | 0 refills | Status: DC | PRN

## 2017-04-02 MED ORDER — SALINE SPRAY 0.65 % NA SOLN
1.0000 | NASAL | 0 refills | Status: DC | PRN
Start: 2017-04-02 — End: 2018-01-27

## 2017-04-02 NOTE — Progress Notes (Signed)
Pre visit review using our clinic review tool, if applicable. No additional management support is needed unless otherwise documented below in the visit note. 

## 2017-04-02 NOTE — Patient Instructions (Addendum)
Do head exercise once a day.  Encourage adequate oral hydration.  Benign Positional Vertigo Vertigo is the feeling that you or your surroundings are moving when they are not. Benign positional vertigo is the most common form of vertigo. The cause of this condition is not serious (is benign). This condition is triggered by certain movements and positions (is positional). This condition can be dangerous if it occurs while you are doing something that could endanger you or others, such as driving. What are the causes? In many cases, the cause of this condition is not known. It may be caused by a disturbance in an area of the inner ear that helps your brain to sense movement and balance. This disturbance can be caused by a viral infection (labyrinthitis), head injury, or repetitive motion. What increases the risk? This condition is more likely to develop in:  Women.  People who are 50 years of age or older. What are the signs or symptoms? Symptoms of this condition usually happen when you move your head or your eyes in different directions. Symptoms may start suddenly, and they usually last for less than a minute. Symptoms may include:  Loss of balance and falling.  Feeling like you are spinning or moving.  Feeling like your surroundings are spinning or moving.  Nausea and vomiting.  Blurred vision.  Dizziness.  Involuntary eye movement (nystagmus). Symptoms can be mild and cause only slight annoyance, or they can be severe and interfere with daily life. Episodes of benign positional vertigo may return (recur) over time, and they may be triggered by certain movements. Symptoms may improve over time. How is this diagnosed? This condition is usually diagnosed by medical history and a physical exam of the head, neck, and ears. You may be referred to a health care provider who specializes in ear, nose, and throat (ENT) problems (otolaryngologist) or a provider who specializes in disorders of  the nervous system (neurologist). You may have additional testing, including:  MRI.  A CT scan.  Eye movement tests. Your health care provider may ask you to change positions quickly while he or she watches you for symptoms of benign positional vertigo, such as nystagmus. Eye movement may be tested with an electronystagmogram (ENG), caloric stimulation, the Dix-Hallpike test, or the roll test.  An electroencephalogram (EEG). This records electrical activity in your brain.  Hearing tests. How is this treated? Usually, your health care provider will treat this by moving your head in specific positions to adjust your inner ear back to normal. Surgery may be needed in severe cases, but this is rare. In some cases, benign positional vertigo may resolve on its own in 2-4 weeks. Follow these instructions at home: Safety   Move slowly.Avoid sudden body or head movements.  Avoid driving.  Avoid operating heavy machinery.  Avoid doing any tasks that would be dangerous to you or others if a vertigo episode would occur.  If you have trouble walking or keeping your balance, try using a cane for stability. If you feel dizzy or unstable, sit down right away.  Return to your normal activities as told by your health care provider. Ask your health care provider what activities are safe for you. General instructions   Take over-the-counter and prescription medicines only as told by your health care provider.  Avoid certain positions or movements as told by your health care provider.  Drink enough fluid to keep your urine clear or pale yellow.  Keep all follow-up visits as told by  your health care provider. This is important. Contact a health care provider if:  You have a fever.  Your condition gets worse or you develop new symptoms.  Your family or friends notice any behavioral changes.  Your nausea or vomiting gets worse.  You have numbness or a "pins and needles" sensation. Get help  right away if:  You have difficulty speaking or moving.  You are always dizzy.  You faint.  You develop severe headaches.  You have weakness in your legs or arms.  You have changes in your hearing or vision.  You develop a stiff neck.  You develop sensitivity to light. This information is not intended to replace advice given to you by your health care provider. Make sure you discuss any questions you have with your health care provider. Document Released: 09/03/2006 Document Revised: 05/03/2016 Document Reviewed: 03/21/2015 Elsevier Interactive Patient Education  2017 [employer].

## 2017-04-02 NOTE — Progress Notes (Signed)
Subjective:  Patient ID: Collin Murphy, male    DOB: Jul 27, 1966  Age: 51 y.o. MRN: [id number]  CC: Dizziness (dizzienss when lay down,right ear. )   Dizziness  This is a new problem. The current episode started in the past 7 days. The problem occurs intermittently. The problem has been waxing and waning. Associated symptoms include congestion and vertigo. Pertinent negatives include no abdominal pain, anorexia, arthralgias, change in bowel habit, chest pain, chills, coughing, diaphoresis, fatigue, fever, headaches, joint swelling, myalgias, nausea, neck pain, numbness, rash, sore throat, swollen glands, urinary symptoms, visual change, vomiting or weakness. Exacerbated by: laying on left side, and moving head too fast. Treatments tried: mucinex D.   Dizziness x 1week. Worse when laying on left side.  Outpatient Medications Prior to Visit  Medication Sig Dispense Refill  . hydrochlorothiazide (HYDRODIURIL) 12.5 MG tablet Take 1-2 tablets (12.5-25 mg total) by mouth daily. (Patient not taking: Reported on 04/02/2017) 30 tablet 1   No facility-administered medications prior to visit.     ROS See HPI  Objective:  BP 122/82   Pulse 60   Temp 98.2 F (36.8 C)   Ht 5' 9.5" (1.765 m)   Wt 296 lb (134.3 kg)   SpO2 97%   BMI 43.08 kg/m   BP Readings from Last 3 Encounters:  04/02/17 122/82  10/03/16 122/72  06/28/16 112/76    Wt Readings from Last 3 Encounters:  04/02/17 296 lb (134.3 kg)  11/21/16 287 lb (130.2 kg)  10/03/16 290 lb 3.2 oz (131.6 kg)    Physical Exam  Constitutional: He is oriented to person, place, and time. No distress.  HENT:  Right Ear: External ear and ear canal normal. No tenderness. Tympanic membrane is not injected and not erythematous. A middle ear effusion is present. No decreased hearing is noted.  Left Ear: External ear and ear canal normal. No tenderness. Tympanic membrane is not injected and not erythematous. A middle ear effusion is present.  No decreased hearing is noted.  Nose: Mucosal edema present. No rhinorrhea. Right sinus exhibits maxillary sinus tenderness. Left sinus exhibits maxillary sinus tenderness.  Mouth/Throat: Oropharynx is clear and moist. No oropharyngeal exudate.  Eyes: Conjunctivae and EOM are normal. Pupils are equal, round, and reactive to light.  Neck: Normal range of motion. Neck supple.  Cardiovascular: Normal rate, regular rhythm and normal heart sounds.   Pulmonary/Chest: Effort normal and breath sounds normal.  Musculoskeletal: Normal range of motion.  Lymphadenopathy:    He has no cervical adenopathy.  Neurological: He is alert and oriented to person, place, and time.  Vitals reviewed.   Lab Results  Component Value Date   WBC 8.2 09/03/2007   HGB 14.2 09/03/2007   HCT 42.4 09/03/2007   PLT 269 09/03/2007   GLUCOSE 150 (H) 08/26/2009   CHOL 187 08/26/2009   TRIG 90.0 08/26/2009   HDL 47.10 08/26/2009   LDLDIRECT 154.5 09/03/2007   LDLCALC 122 (H) 08/26/2009   ALT 38 08/26/2009   AST 27 08/26/2009   NA 142 08/26/2009   K 3.7 08/26/2009   CL 107 08/26/2009   CREATININE 1.3 08/26/2009   BUN 13 08/26/2009   CO2 32 08/26/2009   TSH 1.78 09/03/2007   PSA 0.50 09/03/2007    No results found.  Assessment & Plan:   Collin Murphy was seen today for dizziness.  Diagnoses and all orders for this visit:  BPPV (benign paroxysmal positional vertigo), left -     predniSONE (STERAPRED UNI-PAK 21 TAB)  10 MG (21) TBPK tablet; Take by mouth as directed. -     meclizine (ANTIVERT) 12.5 MG tablet; Take 1 tablet (12.5 mg total) by mouth 2 (two) times daily as needed for dizziness (do not use for more than 3days).  Eustachian tube dysfunction, bilateral -     predniSONE (STERAPRED UNI-PAK 21 TAB) 10 MG (21) TBPK tablet; Take by mouth as directed.  Allergic rhinitis, unspecified seasonality, unspecified trigger -     predniSONE (STERAPRED UNI-PAK 21 TAB) 10 MG (21) TBPK tablet; Take by mouth as  directed. -     sodium chloride (OCEAN) 0.65 % SOLN nasal spray; Place 1 spray into both nostrils as needed for congestion.   I am having Collin Murphy start on predniSONE, sodium chloride, and meclizine. I am also having him maintain his hydrochlorothiazide.  Meds ordered this encounter  Medications  . predniSONE (STERAPRED UNI-PAK 21 TAB) 10 MG (21) TBPK tablet    Sig: Take by mouth as directed.    Dispense:  21 tablet    Refill:  0    Order Specific Question:   Supervising Provider    Answer:   Cassandria Anger [[id number]]  . sodium chloride (OCEAN) 0.65 % SOLN nasal spray    Sig: Place 1 spray into both nostrils as needed for congestion.    Dispense:  15 mL    Refill:  0    Order Specific Question:   Supervising Provider    Answer:   Cassandria Anger [[id number]]  . meclizine (ANTIVERT) 12.5 MG tablet    Sig: Take 1 tablet (12.5 mg total) by mouth 2 (two) times daily as needed for dizziness (do not use for more than 3days).    Dispense:  6 tablet    Refill:  0    Order Specific Question:   Supervising Provider    Answer:   Cassandria Anger [[id number]]    Follow-up: Return if symptoms worsen or fail to improve.  Wilfred Lacy, NP

## 2017-09-25 ENCOUNTER — Encounter: Payer: Self-pay | Admitting: Family Medicine

## 2017-09-25 ENCOUNTER — Ambulatory Visit (INDEPENDENT_AMBULATORY_CARE_PROVIDER_SITE_OTHER): Payer: 59 | Admitting: Family Medicine

## 2017-09-25 VITALS — BP 140/80 | HR 75 | Temp 98.1°F | Ht 69.5 in | Wt 284.0 lb

## 2017-09-25 DIAGNOSIS — M545 Low back pain, unspecified: Secondary | ICD-10-CM | POA: Insufficient documentation

## 2017-09-25 MED ORDER — CYCLOBENZAPRINE HCL 10 MG PO TABS: 10.0000 mg | ORAL_TABLET | Freq: Three times a day (TID) | ORAL | 0 refills | Status: DC | PRN

## 2017-09-25 MED ORDER — MELOXICAM 15 MG PO TABS: 15.0000 mg | ORAL_TABLET | Freq: Every day | ORAL | 0 refills | Status: DC

## 2017-09-25 NOTE — Patient Instructions (Addendum)
Thank you for coming in,   Please take anti-inflammatory for 10-14 days straight and then as needed.   Please continue to ice and heat.   Please try different muscle rubs to apply to the affected area.   Please try the exercises and stretching at least once a day.   Please feel free to call with any questions or concerns at any time, at [phone]. --Dr. Raeford Razor

## 2017-09-25 NOTE — Assessment & Plan Note (Signed)
Appears to be a muscle spasm in nature. No radicular symptoms. No associated with any urinary complaints or infection. - Mobic and Flexeril provided - Counseled on home exercise program - If no improvement can refer to physical therapy and consider imaging

## 2017-09-25 NOTE — Progress Notes (Signed)
Collin Murphy - 51 y.o. male MRN [id number]  Date of birth: [birth year]  SUBJECTIVE:  Including CC & ROS.  Chief Complaint  Patient presents with  . Back Pain    mid back pain patient stated he played golf a few weeks ago and noticed some tension played golf again on the 7th and the pain started to become nagging. states his back is stiff been taking aleve and using a heating pad which helped a little but the pain always comes back    Collin Murphy is a 51 year old male is presenting with bilateral lower back pain. The pain is severe in nature and worse with movements and at the end of the day. He has been golfing and the pains usually worse after golfing. There is no radicular symptoms. The pain is sharp and stabbing in nature. He is not taking any medications. Denies any prior history of similar pain. Denies any surgery on his back. The pain is moderate to severe in nature. Pain goes away if he is able to lie down. Pain seems to be the best in the morning.     Review of Systems  Musculoskeletal: Positive for back pain and myalgias. Negative for gait problem.  Skin: Negative for color change.  Neurological: Negative for weakness and numbness.  Hematological: Negative for adenopathy.    HISTORY: Past Medical, Surgical, Social, and Family History Reviewed & Updated per EMR.   Pertinent Historical Findings include:  Past Medical History:  Diagnosis Date  . ALLERGIC RHINITIS 09/03/2007  . GERD 09/03/2007  . HYPERCHOLESTEROLEMIA 08/26/2009  . HYPERSOMNIA 08/26/2009  . OBSTRUCTIVE SLEEP APNEA 09/21/2009  . Prostatitis    multiple episodes  . TINEA PEDIS 09/03/2007    Past Surgical History:  Procedure Laterality Date  . TOE AMPUTATION     3rd and 4th toes amputated after forklift injury    No Known Allergies  Family History  Problem Relation Age of Onset  . Hypertension Mother   . Heart disease Mother        CAD  . Diabetes Father   . Gout Brother   . COPD Brother   . Cancer  Neg Hx        No history Colon, Prostate Breast CA     Social History   Social History  . Marital status: Married    Spouse name: N/A  . Number of children: 2  . Years of education: 16   Occupational History  . Mental health and Professional barber [employer]   Social History Main Topics  . Smoking status: Never Smoker  . Smokeless tobacco: Never Used  . Alcohol use Yes     Comment: occ/rare  . Drug use: No  . Sexual activity: Yes    Partners: Female   Other Topics Concern  . Not on file   Social History Narrative   HS[employer]. [employer] sociology. Completed barber[employer] college. Married-1990   Children : 1 boy '93, 1 girl '98. Work Engineer, mining.              PHYSICAL EXAM:  VS: BP 140/80 (BP Location: Left Arm, Patient Position: Sitting, Cuff Size: Large)   Pulse 75   Temp 98.1 F (36.7 C) (Oral)   Ht 5' 9.5" (1.765 m)   Wt 284 lb (128.8 kg)   SpO2 98%   BMI 41.34 kg/m  Physical Exam Gen: NAD, alert, cooperative with exam, well-appearing ENT: normal lips, normal nasal mucosa,  Eye: normal EOM, normal conjunctiva and  lids CV:  no edema, +2 pedal pulses   Resp: no accessory muscle use, non-labored,  Skin: no rashes, no areas of induration  Neuro: normal tone, normal sensation to touch Psych:  normal insight, alert and oriented MSK:  Back: No tenderness to palpation of lumbar spine. Some tenderness to palpation over the left and right paraspinal muscle.  Normal internal and external rotation of the hips bilaterally. Normal strength resistance with hip flexion. Negative straight leg raise bilaterally. Normal strength resistance with knee flexion and extension. Neurovascularly intact     ASSESSMENT & PLAN:   Acute bilateral low back pain without sciatica Appears to be a muscle spasm in nature. No radicular symptoms. No associated with any urinary complaints or infection. - Mobic and Flexeril provided - Counseled on home exercise program - If  no improvement can refer to physical therapy and consider imaging

## 2017-10-30 ENCOUNTER — Ambulatory Visit: Payer: 59 | Admitting: Internal Medicine

## 2018-01-27 ENCOUNTER — Encounter: Payer: Self-pay | Admitting: Internal Medicine

## 2018-01-27 ENCOUNTER — Ambulatory Visit (INDEPENDENT_AMBULATORY_CARE_PROVIDER_SITE_OTHER): Payer: 59 | Admitting: Family

## 2018-01-27 ENCOUNTER — Encounter: Payer: Self-pay | Admitting: Family

## 2018-01-27 DIAGNOSIS — Z1211 Encounter for screening for malignant neoplasm of colon: Secondary | ICD-10-CM | POA: Diagnosis not present

## 2018-01-27 DIAGNOSIS — H6593 Unspecified nonsuppurative otitis media, bilateral: Secondary | ICD-10-CM

## 2018-01-27 MED ORDER — FLUTICASONE PROPIONATE 50 MCG/ACT NA SUSP: 2.0000 | Freq: Every day | NASAL | 6 refills | Status: DC

## 2018-01-27 MED ORDER — PREDNISONE 10 MG (21) PO TBPK: ORAL_TABLET | ORAL | 0 refills | Status: DC

## 2018-01-27 MED ORDER — AMOXICILLIN-POT CLAVULANATE 875-125 MG PO TABS: 1.0000 | ORAL_TABLET | Freq: Two times a day (BID) | ORAL | 0 refills | Status: DC

## 2018-01-27 NOTE — Patient Instructions (Signed)
Eustachian Tube Dysfunction The eustachian tube connects the middle ear to the back of the nose. It regulates air pressure in the middle ear by allowing air to move between the ear and nose. It also helps to drain fluid from the middle ear space. When the eustachian tube does not function properly, air pressure, fluid, or both can build up in the middle ear. Eustachian tube dysfunction can affect one or both ears. What are the causes? This condition happens when the eustachian tube becomes blocked or cannot open normally. This may result from:  Ear infections.  Colds and other upper respiratory infections.  Allergies.  Irritation, such as from cigarette smoke or acid from the stomach coming up into the esophagus (gastroesophageal reflux).  Sudden changes in air pressure, such as from descending in an airplane.  Abnormal growths in the nose or throat, such as nasal polyps, tumors, or enlarged tissue at the back of the throat (adenoids).  What increases the risk? This condition may be more likely to develop in people who smoke and people who are overweight. Eustachian tube dysfunction may also be more likely to develop in children, especially children who have:  Certain birth defects of the mouth, such as cleft palate.  Large tonsils and adenoids.  What are the signs or symptoms? Symptoms of this condition may include:  A feeling of fullness in the ear.  Ear pain.  Clicking or popping noises in the ear.  Ringing in the ear.  Hearing loss.  Loss of balance.  Symptoms may get worse when the air pressure around you changes, such as when you travel to an area of high elevation or fly on an airplane. How is this diagnosed? This condition may be diagnosed based on:  Your symptoms.  A physical exam of your ear, nose, and throat.  Tests, such as those that measure: ? The movement of your eardrum (tympanogram). ? Your hearing (audiometry).  How is this treated? Treatment  depends on the cause and severity of your condition. If your symptoms are mild, you may be able to relieve your symptoms by moving air into ("popping") your ears. If you have symptoms of fluid in your ears, treatment may include:  Decongestants.  Antihistamines.  Nasal sprays or ear drops that contain medicines that reduce swelling (steroids).  In some cases, you may need to have a procedure to drain the fluid in your eardrum (myringotomy). In this procedure, a small tube is placed in the eardrum to:  Drain the fluid.  Restore the air in the middle ear space.  Follow these instructions at home:  Take over-the-counter and prescription medicines only as told by your health care provider.  Use techniques to help pop your ears as recommended by your health care provider. These may include: ? Chewing gum. ? Yawning. ? Frequent, forceful swallowing. ? Closing your mouth, holding your nose closed, and gently blowing as if you are trying to blow air out of your nose.  Do not do any of the following until your health care provider approves: ? Travel to high altitudes. ? Fly in airplanes. ? Work in a pressurized cabin or room. ? Scuba dive.  Keep your ears dry. Dry your ears completely after showering or bathing.  Do not smoke.  Keep all follow-up visits as told by your health care provider. This is important. Contact a health care provider if:  Your symptoms do not go away after treatment.  Your symptoms come back after treatment.  You are   unable to pop your ears.  You have: ? A fever. ? Pain in your ear. ? Pain in your head or neck. ? Fluid draining from your ear.  Your hearing suddenly changes.  You become very dizzy.  You lose your balance. This information is not intended to replace advice given to you by your health care provider. Make sure you discuss any questions you have with your health care provider. Document Released: 12/23/2015 Document Revised: 05/03/2016  Document Reviewed: 12/15/2014 Elsevier Interactive Patient Education  2018 Elsevier Inc.  

## 2018-01-27 NOTE — Progress Notes (Signed)
RIDHAAN DREIBELBIS is a 52 y.o. male with the following history as recorded in Epic[facility]:  Patient Active Problem List   Diagnosis Date Noted  . Acute bilateral low back pain without sciatica 09/25/2017  . Bilateral lower extremity edema 06/28/2016  . Contusion, arm, upper 01/12/2016  . Obesity, Class III, BMI 40-49.9 (morbid obesity) ([place]) 03/05/2013  . Routine health maintenance 03/05/2013  . Obstructive sleep apnea 09/21/2009  . HYPERCHOLESTEROLEMIA 08/26/2009  . TINEA PEDIS 09/03/2007  . Allergic rhinitis 09/03/2007    Current Outpatient Medications  Medication Sig Dispense Refill  . amoxicillin-clavulanate (AUGMENTIN) 875-125 MG tablet Take 1 tablet by mouth 2 (two) times daily. 20 tablet 0  . fluticasone (FLONASE) 50 MCG/ACT nasal spray Place 2 sprays into both nostrils daily. 16 g 6  . predniSONE (STERAPRED UNI-PAK 21 TAB) 10 MG (21) TBPK tablet Taper as directed 21 tablet 0   No current facility-administered medications for this visit.     Allergies: Patient has no known allergies.  Past Medical History:  Diagnosis Date  . ALLERGIC RHINITIS 09/03/2007  . GERD 09/03/2007  . HYPERCHOLESTEROLEMIA 08/26/2009  . HYPERSOMNIA 08/26/2009  . OBSTRUCTIVE SLEEP APNEA 09/21/2009  . Prostatitis    multiple episodes  . TINEA PEDIS 09/03/2007    Past Surgical History:  Procedure Laterality Date  . TOE AMPUTATION     3rd and 4th toes amputated after forklift injury    Family History  Problem Relation Age of Onset  . Hypertension Mother   . Heart disease Mother        CAD  . Diabetes Father   . Gout Brother   . COPD Brother   . Cancer Neg Hx        No history Colon, Prostate Breast CA    Social History   Tobacco Use  . Smoking status: Never Smoker  . Smokeless tobacco: Never Used  Substance Use Topics  . Alcohol use: Yes    Comment: occ/rare    Subjective:  Patient presents with concerns for chronic sensation of right ear feeling "clogged up" and dizzy; has seen ENT in  the past ( thinks in the past 2-3 years) for same sensation but does not remember being beneficial; notes that cough has developed in the past 2-3 weeks; called [facility] when cough first started- was given Tessalon Perles and OTC Allergy medication ( pill); cough is improved compared to first onset of symptoms; no fever; no teeth pain; no chest pain, no shortness of breath;   Objective:  Vitals:   01/27/18 0815  BP: 132/88  Pulse: 78  Temp: 97.9 F (36.6 C)  TempSrc: Oral  SpO2: 98%  Weight: 290 lb 1.3 oz (131.6 kg)  Height: 5\' 9"  (1.753 m)    General: Well developed, well nourished, in no acute distress  Skin : Warm and dry.  Head: Normocephalic and atraumatic  Eyes: Sclera and conjunctiva clear; pupils round and reactive to light; extraocular movements intact  Ears: External normal; canals clear; tympanic membranes congested bilaterally Oropharynx: Pink, supple. No suspicious lesions  Neck: Supple without thyromegaly, adenopathy  Lungs: Respirations unlabored; clear to auscultation bilaterally without wheeze, rales, rhonchi  CVS exam: normal rate and regular rhythm.  Neurologic: Alert and oriented; speech intact; face symmetrical; moves all extremities well; CNII-XII intact without focal deficit  Assessment:  1. Bilateral serous otitis media, unspecified chronicity   2. Encounter for screening colonoscopy     Plan:  1. Rx for Augmentin 875 mg bid x 10 days; Rx  for Prednisone Taper pack and Flonase; continue using Flonase for maintenance; literature provided; may need to refer to ENT; 2. Referral updated; keep planned appt for CPE in May 2019; of note, has been getting labs done yearly with wife's employer;   No Follow-up on file.  Orders Placed This Encounter  Procedures  . Ambulatory referral to Gastroenterology    Referral Priority:   Routine    Referral Type:   Consultation    Referral Reason:   Specialty Services Required    Number of Visits Requested:   1    Requested  Prescriptions   Signed Prescriptions Disp Refills  . amoxicillin-clavulanate (AUGMENTIN) 875-125 MG tablet 20 tablet 0    Sig: Take 1 tablet by mouth 2 (two) times daily.  . predniSONE (STERAPRED UNI-PAK 21 TAB) 10 MG (21) TBPK tablet 21 tablet 0    Sig: Taper as directed  . fluticasone (FLONASE) 50 MCG/ACT nasal spray 16 g 6    Sig: Place 2 sprays into both nostrils daily.

## 2018-04-14 ENCOUNTER — Other Ambulatory Visit: Payer: Self-pay

## 2018-04-14 ENCOUNTER — Ambulatory Visit (AMBULATORY_SURGERY_CENTER): Payer: Self-pay | Admitting: *Deleted

## 2018-04-14 ENCOUNTER — Encounter (INDEPENDENT_AMBULATORY_CARE_PROVIDER_SITE_OTHER): Payer: Self-pay

## 2018-04-14 VITALS — Ht 69.0 in | Wt 294.0 lb

## 2018-04-14 DIAGNOSIS — Z1211 Encounter for screening for malignant neoplasm of colon: Secondary | ICD-10-CM

## 2018-04-14 MED ORDER — NA SULFATE-K SULFATE-MG SULF 17.5-3.13-1.6 GM/177ML PO SOLN: ORAL | 0 refills | Status: DC

## 2018-04-14 NOTE — Progress Notes (Signed)
Patient denies any allergies to eggs or soy. Patient denies any problems with anesthesia/sedation. Patient denies any oxygen use at home. Patient denies taking any diet/weight loss medications or blood thinners. EMMI education assisgned to patient on colonoscopy, this was explained and instructions given to patient. 

## 2018-04-15 ENCOUNTER — Encounter: Payer: Self-pay | Admitting: Internal Medicine

## 2018-04-28 ENCOUNTER — Other Ambulatory Visit: Payer: Self-pay

## 2018-04-28 ENCOUNTER — Encounter: Payer: Self-pay | Admitting: Internal Medicine

## 2018-04-28 ENCOUNTER — Ambulatory Visit (AMBULATORY_SURGERY_CENTER): Payer: 59 | Admitting: Internal Medicine

## 2018-04-28 VITALS — BP 138/90 | HR 60 | Temp 97.8°F | Resp 19 | Ht 69.0 in | Wt 294.0 lb

## 2018-04-28 DIAGNOSIS — Z1211 Encounter for screening for malignant neoplasm of colon: Secondary | ICD-10-CM

## 2018-04-28 DIAGNOSIS — D123 Benign neoplasm of transverse colon: Secondary | ICD-10-CM

## 2018-04-28 DIAGNOSIS — K635 Polyp of colon: Secondary | ICD-10-CM | POA: Diagnosis not present

## 2018-04-28 MED ORDER — SODIUM CHLORIDE 0.9 % IV SOLN: 500.0000 mL | Freq: Once | INTRAVENOUS | Status: DC

## 2018-04-28 NOTE — Patient Instructions (Signed)
YOU HAD AN ENDOSCOPIC PROCEDURE TODAY AT THE [facility]:   Refer to the procedure report that was given to you for any specific questions about what was found during the examination.  If the procedure report does not answer your questions, please call your gastroenterologist to clarify.  If you requested that your care partner not be given the details of your procedure findings, then the procedure report has been included in a sealed envelope for you to review at your convenience later.  YOU SHOULD EXPECT: Some feelings of bloating in the abdomen. Passage of more gas than usual.  Walking can help get rid of the air that was put into your GI tract during the procedure and reduce the bloating. If you had a lower endoscopy (such as a colonoscopy or flexible sigmoidoscopy) you may notice spotting of blood in your stool or on the toilet paper. If you underwent a bowel prep for your procedure, you may not have a normal bowel movement for a few days.  Please Note:  You might notice some irritation and congestion in your nose or some drainage.  This is from the oxygen used during your procedure.  There is no need for concern and it should clear up in a day or so.  SYMPTOMS TO REPORT IMMEDIATELY:   Following lower endoscopy (colonoscopy or flexible sigmoidoscopy):  Excessive amounts of blood in the stool  Significant tenderness or worsening of abdominal pains  Swelling of the abdomen that is new, acute  Fever of 100F or higher  For urgent or emergent issues, a gastroenterologist can be reached at any hour by calling [phone].   DIET:  We do recommend a small meal at first, but then you may proceed to your regular diet.  Drink plenty of fluids but you should avoid alcoholic beverages for 24 hours.  MEDICATIONS: Continue present medications.  Please see handouts given to you by your recovery nurse.  ACTIVITY:  You should plan to take it easy for the rest of today and you should NOT  DRIVE or use heavy machinery until tomorrow (because of the sedation medicines used during the test).    FOLLOW UP: Our staff will call the number listed on your records the next business day following your procedure to check on you and address any questions or concerns that you may have regarding the information given to you following your procedure. If we do not reach you, we will leave a message.  However, if you are feeling well and you are not experiencing any problems, there is no need to return our call.  We will assume that you have returned to your regular daily activities without incident.  If any biopsies were taken you will be contacted by phone or by letter within the next 1-3 weeks.  Please call us at [phone] if you have not heard about the biopsies in 3 weeks.   Thank you for allowing us to provide for your healthcare needs today.   SIGNATURES/CONFIDENTIALITY: You and/or your care partner have signed paperwork which will be entered into your electronic medical record.  These signatures attest to the fact that that the information above on your After Visit Summary has been reviewed and is understood.  Full responsibility of the confidentiality of this discharge information lies with you and/or your care-partner. 

## 2018-04-28 NOTE — Progress Notes (Signed)
Report given to PACU, vss 

## 2018-04-28 NOTE — Op Note (Signed)
[place] Patient Name: Collin Murphy Procedure Date: 04/28/2018 9:04 AM MRN: [id number] Endoscopist: Jerene Bears , MD Age: 52 Referring MD:  Date of Birth: 06/24/1966 Gender: Male Account #: [id number] Procedure:                Colonoscopy Indications:              Screening for colorectal malignant neoplasm, This                            is the patient's first colonoscopy Medicines:                Monitored Anesthesia Care Procedure:                Pre-Anesthesia Assessment:                           - Prior to the procedure, a History and Physical                            was performed, and patient medications and                            allergies were reviewed. The patient's tolerance of                            previous anesthesia was also reviewed. The risks                            and benefits of the procedure and the sedation                            options and risks were discussed with the patient.                            All questions were answered, and informed consent                            was obtained. Prior Anticoagulants: The patient has                            taken no previous anticoagulant or antiplatelet                            agents. ASA Grade Assessment: III - A patient with                            severe systemic disease. After reviewing the risks                            and benefits, the patient was deemed in                            satisfactory condition to undergo the procedure.  After obtaining informed consent, the colonoscope                            was passed under direct vision. Throughout the                            procedure, the patient's blood pressure, pulse, and                            oxygen saturations were monitored continuously. The                            Colonoscope was introduced through the anus and                            advanced to the cecum,  identified by appendiceal                            orifice and ileocecal valve. The colonoscopy was                            performed without difficulty. The patient tolerated                            the procedure well. The quality of the bowel                            preparation was excellent. The ileocecal valve,                            appendiceal orifice, and rectum were photographed. Scope In: 9:16:39 AM Scope Out: 9:27:57 AM Scope Withdrawal Time: 0 hours 10 minutes 15 seconds  Total Procedure Duration: 0 hours 11 minutes 18 seconds  Findings:                 The digital rectal exam was normal.                           A 3 mm polyp was found in the transverse colon. The                            polyp was sessile. The polyp was removed with a                            cold snare. Resection and retrieval were complete.                           The exam was otherwise without abnormality on                            direct and retroflexion views. Complications:            No immediate complications. Estimated Blood Loss:     Estimated blood loss: none. Impression:               -  One 3 mm polyp in the transverse colon, removed                            with a cold snare. Resected and retrieved.                           - The examination was otherwise normal on direct                            and retroflexion views. Recommendation:           - Patient has a contact number available for                            emergencies. The signs and symptoms of potential                            delayed complications were discussed with the                            patient. Return to normal activities tomorrow.                            Written discharge instructions were provided to the                            patient.                           - Resume previous diet.                           - Continue present medications.                           - Await  pathology results.                           - Repeat colonoscopy is recommended. The                            colonoscopy date will be determined after pathology                            results from today's exam become available for                            review. Jerene Bears, MD 04/28/2018 9:30:24 AM This report has been signed electronically.

## 2018-04-28 NOTE — Progress Notes (Signed)
Called to room to assist during endoscopic procedure.  Patient ID and intended procedure confirmed with present staff. Received instructions for my participation in the procedure from the performing physician.  

## 2018-04-29 ENCOUNTER — Telehealth: Payer: Self-pay | Admitting: *Deleted

## 2018-04-29 NOTE — Telephone Encounter (Signed)
  Follow up Call-  Call back number 04/28/2018  Post procedure Call Back phone  # [phone]  Permission to leave phone message Yes  Some recent data might be hidden     Patient questions:  Do you have a fever, pain , or abdominal swelling? No. Pain Score  0 *  Have you tolerated food without any problems? Yes.    Have you been able to return to your normal activities? Yes.    Do you have any questions about your discharge instructions: Diet   No. Medications  No. Follow up visit  No.  Do you have questions or concerns about your Care? No.  Actions: * If pain score is 4 or above: No action needed, pain <4.

## 2018-04-30 ENCOUNTER — Ambulatory Visit (INDEPENDENT_AMBULATORY_CARE_PROVIDER_SITE_OTHER): Payer: 59 | Admitting: Nurse Practitioner

## 2018-04-30 ENCOUNTER — Encounter: Payer: Self-pay | Admitting: Nurse Practitioner

## 2018-04-30 VITALS — BP 122/78 | HR 73 | Temp 98.3°F | Resp 16 | Ht 69.0 in | Wt 284.0 lb

## 2018-04-30 DIAGNOSIS — R600 Localized edema: Secondary | ICD-10-CM | POA: Diagnosis not present

## 2018-04-30 DIAGNOSIS — H938X1 Other specified disorders of right ear: Secondary | ICD-10-CM | POA: Diagnosis not present

## 2018-04-30 NOTE — Progress Notes (Signed)
Name: Collin Murphy   MRN: [id number]    DOB: 1966/09/28   Date:04/30/2018       Progress Note  Subjective  Chief Complaint  Chief Complaint  Patient presents with  . Establish Care    feels like fluid is in right ear, leg swelling    HPI Collin Murphy is establishing care with me today, transferring from another provider in our practice. He would like to discuss two acute complaints today:   1) Ear Problem- This is a chronic problem He c/o right ear pain and pressure occurring daily, he says his ear "feels stopped up like theres water in my ear." He experiences popping sensation when leaning forward. He was prescribed course of augmentin and prednisone in February which he completed as prescribed. He used the flonase for some time after he was seen in february but stopped because he did not think it was helping.  He saw an ENTprovider about 6 years ago for similar symptoms with no instructions for further follow up. He denies fevers, syncope, dizziness, vision changes.  He reports nasal congestion and sneezing.  2) Leg swelling- This is a chronic problem. He c/o BLE swelling since around 2004, after experiencing a crush injury to left foot with amputation of 2 toes. He says that since the injury occurred, he "overcompenstates" with his right leg and often notices his legs seem swollen, worse at end of day, even with wearing compression tights.  Often his right leg seems more swollen than his left He was on "fluid pills" for the swelling in the past but he didn't like the way they made him feel so he stopped taking them. He c/o pain in both of his feet. He denies cough, chest pain, shortness of breath, weight gain, leg pain.  Wt Readings from Last 3 Encounters:  04/30/18 284 lb (128.8 kg)  04/28/18 294 lb (133.4 kg)  04/14/18 294 lb (133.4 kg)     Patient Active Problem List   Diagnosis Date Noted  . Acute bilateral low back pain without sciatica 09/25/2017  . Bilateral  lower extremity edema 06/28/2016  . Contusion, arm, upper 01/12/2016  . Obesity, Class III, BMI 40-49.9 (morbid obesity) ([place]) 03/05/2013  . Routine health maintenance 03/05/2013  . Obstructive sleep apnea 09/21/2009  . HYPERCHOLESTEROLEMIA 08/26/2009  . TINEA PEDIS 09/03/2007  . Allergic rhinitis 09/03/2007    Past Surgical History:  Procedure Laterality Date  . TOE AMPUTATION     3rd and 4th toes amputated after forklift injury    Family History  Problem Relation Age of Onset  . Hypertension Mother   . Heart disease Mother        CAD  . Diabetes Father   . Gout Brother   . COPD Brother   . Cancer Neg Hx        No history Colon, Prostate Breast CA  . Colon cancer Neg Hx   . Esophageal cancer Neg Hx   . Stomach cancer Neg Hx     Social History   Socioeconomic History  . Marital status: Married    Spouse name: Not on file  . Number of children: 2  . Years of education: 54  . Highest education level: Not on file  Occupational History  . Occupation: English as a second language teacher: [employer]  . Financial resource strain: Not on file  . Food insecurity:    Worry: Not on file    Inability: Not  on file  . Transportation needs:    Medical: Not on file    Non-medical: Not on file  Tobacco Use  . Smoking status: Current Some Day Smoker    Types: Cigars  . Smokeless tobacco: Never Used  Substance and Sexual Activity  . Alcohol use: Yes    Comment: maybe monthly per pt.  . Drug use: No  . Sexual activity: Yes    Partners: Female  Lifestyle  . Physical activity:    Days per week: Not on file    Minutes per session: Not on file  . Stress: Not on file  Relationships  . Social connections:    Talks on phone: Not on file    Gets together: Not on file    Attends religious service: Not on file    Active member of club or organization: Not on file    Attends meetings of clubs or organizations: Not on file    Relationship status:  Not on file  . Intimate partner violence:    Fear of current or ex partner: Not on file    Emotionally abused: Not on file    Physically abused: Not on file    Forced sexual activity: Not on file  Other Topics Concern  . 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 Completed [employer]. Married-1990   Children : 1 boy '93, 1 girl '98. Work Engineer, mining.              Current Outpatient Medications:  .  fluticasone (FLONASE) 50 MCG/ACT nasal spray, Place 2 sprays into both nostrils daily., Disp: 16 g, Rfl: 6  Current Facility-Administered Medications:  .  0.9 %  sodium chloride infusion, 500 mL, Intravenous, Once, Pyrtle, Lajuan Lines, MD  No Known Allergies   ROS See HPI  Objective  Vitals:   04/30/18 1337  BP: 122/78  Pulse: 73  Resp: 16  Temp: 98.3 F (36.8 C)  TempSrc: Oral  SpO2: 94%  Weight: 284 lb (128.8 kg)  Height: 5\' 9"  (1.753 m)   Body mass index is 41.94 kg/m.  Physical Exam Vital signs reviewed. Constitutional: Patient appears well-developed and well-nourished. No distress.  HENT: Head: Normocephalic and atraumatic. Ears: Bilateral TMs without bulging or effusion; bilateral external ear canals mildly erythematous. Nose: Nose normal. Mouth/Throat: Oropharynx is clear and moist. No oropharyngeal exudate.  Eyes: Conjunctivae and EOM are normal. Pupils are equal, round, and reactive to light. No scleral icterus.  Neck: Normal range of motion. Neck supple. No cervical adenopathy. Cardiovascular: Normal rate, regular rhythm and normal heart sounds.  No murmur heard. No BLE edema. Distal pulses intact. Pulmonary/Chest: Effort normal and breath sounds normal. No respiratory distress. Musculoskeletal: Normal range of motion.   Neurological: He is alert and oriented to person, place, and time.  Coordination, balance, strength, speech and gait are normal.  Skin: Skin is warm and dry. No rash noted. No erythema.  Psychiatric:  Patient has a normal mood and affect. behavior is normal. Judgment and thought content normal.  PHQ2/9: Depression screen PHQ 2/9 01/27/2018  Decreased Interest 0  Down, Depressed, Hopeless 0  PHQ - 2 Score 0     Assessment & Plan RTC for CPE after specialty visits  1. Bilateral lower extremity edema ?could be related to prior leg injury or chronic vascular insufficiency PE unremarkable today Discussed referral to vascular surgery for further evaluation and he is agreeable - Ambulatory referral to Vascular Surgery  2. Pressure  sensation in right ear ?could be related to allergic rhinitis but due to continued symptoms and discomfort, referral to ENT was recommended for further evaluation and he is agreeable - Ambulatory referral to ENT

## 2018-04-30 NOTE — Patient Instructions (Addendum)
I have placed a referral to vascular and ENT. Our office will call you to schedule this appointment. You should hear from our office in 7-10 days.  I would like for you to come back in for a physical with fasting labwork at your convenience, after your referrals.  It was nice to meet you. Thanks for letting me take care of you today :)

## 2018-05-02 ENCOUNTER — Encounter: Payer: Self-pay | Admitting: Internal Medicine

## 2018-05-12 ENCOUNTER — Other Ambulatory Visit: Payer: Self-pay

## 2018-05-12 DIAGNOSIS — R609 Edema, unspecified: Secondary | ICD-10-CM

## 2018-06-05 ENCOUNTER — Telehealth: Payer: Self-pay | Admitting: Internal Medicine

## 2018-06-05 MED ORDER — SACCHAROMYCES BOULARDII 250 MG PO CAPS: ORAL_CAPSULE | ORAL | 0 refills | Status: DC

## 2018-06-05 MED ORDER — PANTOPRAZOLE SODIUM 40 MG PO TBEC: 40.0000 mg | DELAYED_RELEASE_TABLET | Freq: Every day | ORAL | 0 refills | Status: DC

## 2018-06-05 NOTE — Telephone Encounter (Signed)
Patient given recommendations. Rx's sent to pharmacy. 

## 2018-06-05 NOTE — Telephone Encounter (Signed)
Possible that his gut flora was altered by the bowel preparation with the colonoscopy Would recommend pantoprazole 40 mg x 1 month which should help with acid reflux Also Florastor 250 mg twice daily x1 month as a probiotic to help restore gut flora/balance If symptoms persist thereafter he should be seen in the office with me or an advanced practitioner

## 2018-06-05 NOTE — Telephone Encounter (Signed)
Patient states he has had gas, acid reflux and back pain since having the colonoscopy. Reports he feels like he needs to belch and is bloated. He has only tried Tums for the acid reflux and has not tried anything for the gas. He denied having any back pain prior to the procedure. He did go to Urgent Care this weekend. He was told it was a muscle spasm and  was given Naproxen and Flexeril. He has not taken the Flexeril but is taking Naproxen. Discussed with patient that the back pain is not likely related to his procedure but will let MD review his symptoms. Please, advise

## 2018-06-10 DIAGNOSIS — H938X1 Other specified disorders of right ear: Secondary | ICD-10-CM | POA: Insufficient documentation

## 2018-06-24 ENCOUNTER — Ambulatory Visit (HOSPITAL_COMMUNITY)
Admission: RE | Admit: 2018-06-24 | Discharge: 2018-06-24 | Disposition: A | Discharge status: Home / Self Care | Payer: 59 | Source: Ambulatory Visit | Attending: Vascular Surgery | Admitting: Vascular Surgery

## 2018-06-24 ENCOUNTER — Ambulatory Visit (INDEPENDENT_AMBULATORY_CARE_PROVIDER_SITE_OTHER): Payer: 59 | Admitting: Vascular Surgery

## 2018-06-24 ENCOUNTER — Other Ambulatory Visit: Payer: Self-pay

## 2018-06-24 ENCOUNTER — Encounter: Payer: Self-pay | Admitting: Vascular Surgery

## 2018-06-24 VITALS — BP 163/86 | HR 80 | Temp 98.5°F | Resp 20 | Ht 69.0 in | Wt 289.0 lb

## 2018-06-24 DIAGNOSIS — R609 Edema, unspecified: Secondary | ICD-10-CM | POA: Diagnosis present

## 2018-06-24 NOTE — Progress Notes (Signed)
Vascular and Vein Specialist of [facility]  Patient name: Collin Murphy MRN: [id number] DOB: 22-Jan-1966 Sex: male  REASON FOR CONSULT: Evaluation bilateral lower extremity swelling  HPI: Collin Murphy is a 52 y.o. male, who is here today for evaluation of bilateral lower extremity swelling.  He reports this is been present for better than 1 year.  He notes that he has complete resolution overnight and then continues to have progression in bilateral lower extremity swelling throughout the day.  This does not extend down onto the dorsum of his foot or toes bilaterally.  He has no history of DVT.  No history of congestive heart failure.  Venous varicosities.  He denies any pain associated with this.  Has worn support hose in the past with some relief.  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  Did have trauma in his left foot in the past from a forklift accident resulting in the amputation of several toes from his left foot.  Past Medical History:  Diagnosis Date  . ALLERGIC RHINITIS 09/03/2007  . Allergy   . GERD 09/03/2007  . HYPERCHOLESTEROLEMIA 08/26/2009  . HYPERSOMNIA 08/26/2009  . OBSTRUCTIVE SLEEP APNEA 09/21/2009  . Prostatitis    multiple episodes  . Sleep apnea    CPAP use sometimes  . TINEA PEDIS 09/03/2007    Family History  Problem Relation Age of Onset  . Hypertension Mother   . Heart disease Mother        CAD  . Diabetes Father   . Gout Brother   . COPD Brother   . Cancer Neg Hx        No history Colon, Prostate Breast CA  . Colon cancer Neg Hx   . Esophageal cancer Neg Hx   . Stomach cancer Neg Hx     SOCIAL HISTORY: Social History   Socioeconomic History  . Marital status: Married    Spouse name: Not on file  . Number of children: 2  . Years of education: 78  . Highest education level: Not on file  Occupational History  . Occupation: Firefighter: [employer]  . Financial resource strain: Not on file  . Food insecurity:    Worry: Not on file    Inability: Not on file  . Transportation needs:    Medical: Not on file    Non-medical: Not on file  Tobacco Use  . Smoking status: Current Some Day Smoker    Types: Cigars  . Smokeless tobacco: Never Used  . Tobacco comment: Very rarely.   Substance and Sexual Activity  . Alcohol use: Yes    Comment: maybe monthly per pt.  . Drug use: No  . Sexual activity: Yes    Partners: Female  Lifestyle  . Physical activity:    Days per week: Not on file    Minutes per session: Not on file  . Stress: Not on file  Relationships  . Social connections:    Talks on phone: Not on file    Gets together: Not on file    Attends religious service: Not on file    Active member of club or organization: Not on file    Attends meetings of clubs or organizations: Not on file    Relationship status: Not on file  . Intimate partner violence:    Fear of current or ex partner: Not on file  Emotionally abused: Not on file    Physically abused: Not on file    Forced sexual activity: Not on file  Other Topics Concern  . 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 Completed [employer]. Married-1990   Children : 1 boy '93, 1 girl '98. Work Engineer, mining.             No Known Allergies  Current Outpatient Medications  Medication Sig Dispense Refill  . fluticasone (FLONASE) 50 MCG/ACT nasal spray Place 2 sprays into both nostrils daily. 16 g 6  . pantoprazole (PROTONIX) 40 MG tablet Take 1 tablet (40 mg total) by mouth daily. 30 tablet 0  . saccharomyces boulardii (FLORASTOR) 250 MG capsule Take one po BID x 1 month 60 capsule 0   Current Facility-Administered Medications  Medication Dose Route Frequency Provider Last Rate Last Dose  . 0.9 %  sodium chloride infusion  500 mL Intravenous Once Pyrtle, Lajuan Lines, MD        REVIEW OF  SYSTEMS:  [X]  denotes positive finding, [ ]  denotes negative finding Cardiac  Comments:  Chest pain or chest pressure:    Shortness of breath upon exertion:    Short of breath when lying flat:    Irregular heart rhythm:        Vascular    Pain in calf, thigh, or hip brought on by ambulation: x   Pain in feet at night that wakes you up from your sleep:     Blood clot in your veins:    Leg swelling:  x       Pulmonary    Oxygen at home:    Productive cough:     Wheezing:         Neurologic    Sudden weakness in arms or legs:     Sudden numbness in arms or legs:     Sudden onset of difficulty speaking or slurred speech:    Temporary loss of vision in one eye:     Problems with dizziness:         Gastrointestinal    Blood in stool:     Vomited blood:         Genitourinary    Burning when urinating:     Blood in urine:        Psychiatric    Major depression:         Hematologic    Bleeding problems:    Problems with blood clotting too easily:        Skin    Rashes or ulcers:        Constitutional    Fever or chills:      PHYSICAL EXAM: Vitals:   06/24/18 1505  BP: (!) 163/86  Pulse: 80  Resp: 20  Temp: 98.5 F (36.9 C)  TempSrc: Oral  SpO2: 94%  Weight: 289 lb (131.1 kg)  Height: 5\' 9"  (1.753 m)    GENERAL: The patient is a well-nourished male, in no acute distress. The vital signs are documented above. CARDIOVASCULAR: 2+ dorsalis pedis pulses bilaterally.  Pitting edema from his knees down to his ankles bilaterally.  Skin without changes of chronic venous hypertension PULMONARY: There is good air exchange  ABDOMEN: Soft and non-tender  MUSCULOSKELETAL: There are no major deformities or cyanosis.  Amputations of third and fourth toes left foot nEUROLOGIC: No focal weakness or paresthesias are detected. SKIN: There are no ulcers or rashes noted. PSYCHIATRIC: The patient  has a normal affect.  DATA:  Venous duplex in our office today shows no evidence  of deep or superficial venous reflux.  Saphenous vein is not dilated right or left leg  MEDICAL ISSUES: No evidence of arterial or venous pathology.  Did explain the importance of elevation and compression.  Potentially would benefit from diuretic therapy as well.  We have fitted him today for 20 to 30 mmHg compression garments and instructed on the daily use of these.  He will contact us if he has further questions   Rosetta Posner, MD [facility] Tel [phone] Pager [phone]

## 2018-07-04 ENCOUNTER — Other Ambulatory Visit: Payer: Self-pay | Admitting: Internal Medicine

## 2018-08-02 ENCOUNTER — Other Ambulatory Visit: Payer: Self-pay | Admitting: Internal Medicine

## 2018-11-26 ENCOUNTER — Encounter: Payer: Self-pay | Admitting: Nurse Practitioner

## 2018-11-26 ENCOUNTER — Ambulatory Visit (INDEPENDENT_AMBULATORY_CARE_PROVIDER_SITE_OTHER): Payer: 59 | Admitting: Nurse Practitioner

## 2018-11-26 VITALS — BP 130/80 | HR 72 | Ht 69.0 in | Wt 290.0 lb

## 2018-11-26 DIAGNOSIS — M546 Pain in thoracic spine: Secondary | ICD-10-CM | POA: Diagnosis not present

## 2018-11-26 DIAGNOSIS — K219 Gastro-esophageal reflux disease without esophagitis: Secondary | ICD-10-CM

## 2018-11-26 MED ORDER — PANTOPRAZOLE SODIUM 40 MG PO TBEC: 40.0000 mg | DELAYED_RELEASE_TABLET | Freq: Every day | ORAL | 1 refills | Status: DC

## 2018-11-26 MED ORDER — CYCLOBENZAPRINE HCL 5 MG PO TABS
5.0000 mg | ORAL_TABLET | Freq: Three times a day (TID) | ORAL | 0 refills | Status: DC | PRN
Start: 2018-11-26 — End: 2019-12-16

## 2018-11-26 NOTE — Progress Notes (Signed)
Collin Murphy is a 52 y.o. male with the following history as recorded in Epic[facility]:  Patient Active Problem List   Diagnosis Date Noted  . Acute bilateral low back pain without sciatica 09/25/2017  . Bilateral lower extremity edema 06/28/2016  . Contusion, arm, upper 01/12/2016  . Obesity, Class III, BMI 40-49.9 (morbid obesity) ([place]) 03/05/2013  . Routine health maintenance 03/05/2013  . Obstructive sleep apnea 09/21/2009  . HYPERCHOLESTEROLEMIA 08/26/2009  . TINEA PEDIS 09/03/2007  . Allergic rhinitis 09/03/2007    Current Outpatient Medications  Medication Sig Dispense Refill  . fluticasone (FLONASE) 50 MCG/ACT nasal spray Place 2 sprays into both nostrils daily. 16 g 6  . saccharomyces boulardii (FLORASTOR) 250 MG capsule Take one po BID x 1 month 60 capsule 0  . cyclobenzaprine (FLEXERIL) 5 MG tablet Take 1 tablet (5 mg total) by mouth 3 (three) times daily as needed for muscle spasms. 30 tablet 0  . pantoprazole (PROTONIX) 40 MG tablet Take 1 tablet (40 mg total) by mouth daily. 30 tablet 1   Current Facility-Administered Medications  Medication Dose Route Frequency Provider Last Rate Last Dose  . 0.9 %  sodium chloride infusion  500 mL Intravenous Once Pyrtle, Lajuan Lines, MD        Allergies: Patient has no known allergies.  Past Medical History:  Diagnosis Date  . ALLERGIC RHINITIS 09/03/2007  . Allergy   . GERD 09/03/2007  . HYPERCHOLESTEROLEMIA 08/26/2009  . HYPERSOMNIA 08/26/2009  . OBSTRUCTIVE SLEEP APNEA 09/21/2009  . Prostatitis    multiple episodes  . Sleep apnea    CPAP use sometimes  . TINEA PEDIS 09/03/2007    Past Surgical History:  Procedure Laterality Date  . TOE AMPUTATION     3rd and 4th toes amputated after forklift injury    Family History  Problem Relation Age of Onset  . Hypertension Mother   . Heart disease Mother        CAD  . Diabetes Father   . Gout Brother   . COPD Brother   . Cancer Neg Hx        No history Colon, Prostate Breast CA   . Colon cancer Neg Hx   . Esophageal cancer Neg Hx   . Stomach cancer Neg Hx     Social History   Tobacco Use  . Smoking status: Current Some Day Smoker    Types: Cigars  . Smokeless tobacco: Never Used  . Tobacco comment: Very rarely.   Substance Use Topics  . Alcohol use: Yes    Comment: maybe monthly per pt.     Subjective:  Collin Murphy Is here today for evaluation of two acute complaints: back pain and gerd.  Back pain- this is a new problem He describes as "catch in his right mid back, like a pulled muscle" which first started last week. He denies any known injuries but does report frequent lifting and stands all day at work. He says he had similar back pain last year which he saw sports med for and improved with flexeril course. He has tried advil at home which helped some. He denies weakness, falls, numbness, tingling, bowel or bladder changes.  GERD- Has noticed increased GERD symptoms- heartburn, bloating, belching over several months since stopping his protonix. He was started on 1 month course of protonix by GI this past June for GERD, says his symptoms mostly improved while on the protonix, but since off has had worsening symptoms especially with fried, fatty foods. Denies  abdominal pain, cp, nausea, vomiting.   ROS- See HPI  Objective:  Vitals:   11/26/18 1404  BP: 130/80  Pulse: 72  SpO2: 97%  Weight: 290 lb (131.5 kg)  Height: 5\' 9"  (1.753 m)    General: Well developed, well nourished, in no acute distress  Skin : Warm and dry.  Head: Normocephalic and atraumatic  Eyes: Sclera and conjunctiva clear; pupils round and reactive to light; extraocular movements intact  Oropharynx: Pink, supple. No suspicious lesions  Neck: Supple without thyromegaly, adenopathy  Lungs: Respirations unlabored; clear to auscultation bilaterally  CVS exam: normal rate and regular rhythm, S1 and S2 normal.  Abdomen: Soft; nontender; rotund; no masses or hepatosplenomegaly   Musculoskeletal:  Thoracic back: He exhibits right sided tenderness. He exhibits normal range of motion, no swelling, no edema and no deformity.  Extremities: No edema, cyanosis Vessels: Symmetric bilaterally  Neurologic: Alert and oriented; speech intact; face symmetrical; moves all extremities well; CNII-XII intact without focal deficit  Psychiatric: Normal mood and affect.  Assessment:  1. Gastroesophageal reflux disease, esophagitis presence not specified   2. Acute right-sided thoracic back pain     Plan:   Acute right-sided thoracic back pain Flexeril course sent-dosing and side effects discussed Home management, daily exercises, red flags and return precautions including when to seek immediate care discussed and printed on AVS He was instructed to f/u for new, worsening symptoms or if pain persists >2 weeks - cyclobenzaprine (FLEXERIL) 5 MG tablet; Take 1 tablet (5 mg total) by mouth 3 (three) times daily as needed for muscle spasms.  Dispense: 30 tablet; Refill: 0  Return for cpe.  No orders of the defined types were placed in this encounter.   Requested Prescriptions   Signed Prescriptions Disp Refills  . cyclobenzaprine (FLEXERIL) 5 MG tablet 30 tablet 0    Sig: Take 1 tablet (5 mg total) by mouth 3 (three) times daily as needed for muscle spasms.  . pantoprazole (PROTONIX) 40 MG tablet 30 tablet 1    Sig: Take 1 tablet (40 mg total) by mouth daily.

## 2018-11-26 NOTE — Assessment & Plan Note (Signed)
Per chart review, GI requested patient to follow up if GERD symptoms persisted after protonix course I am having him resume protonix today, and call GI to schedule follow up for further evaluation - pantoprazole (PROTONIX) 40 MG tablet; Take 1 tablet (40 mg total) by mouth daily.  Dispense: 30 tablet; Refill: 1

## 2018-11-26 NOTE — Patient Instructions (Addendum)
Please re-start protonix 40 daily Please call GI for follow up of acid reflux: Address: [address], [place], [place] [address]  Phone: [phone]  Start flexeril as needed for mucle spasms/back pain Also heat and stretching Please schedule a follow up appointment for further evaluation here with Dr Tamala Julian or Dr Raeford Razor, our sports medicine providers, if your pain is not better in 2 weeks   Back Exercises If you have pain in your back, do these exercises 2-3 times each day or as told by your doctor. When the pain goes away, do the exercises once each day, but repeat the steps more times for each exercise (do more repetitions). If you do not have pain in your back, do these exercises once each day or as told by your doctor. Exercises Single Knee to Chest Do these steps 3-5 times in a row for each leg: 1. Lie on your back on a firm bed or the floor with your legs stretched out. 2. Bring one knee to your chest. 3. Hold your knee to your chest by grabbing your knee or thigh. 4. Pull on your knee until you feel a gentle stretch in your lower back. 5. Keep doing the stretch for 10-30 seconds. 6. Slowly let go of your leg and straighten it. Pelvic Tilt Do these steps 5-10 times in a row: 1. Lie on your back on a firm bed or the floor with your legs stretched out. 2. Bend your knees so they point up to the ceiling. Your feet should be flat on the floor. 3. Tighten your lower belly (abdomen) muscles to press your lower back against the floor. This will make your tailbone point up to the ceiling instead of pointing down to your feet or the floor. 4. Stay in this position for 5-10 seconds while you gently tighten your muscles and breathe evenly. Cat-Cow Do these steps until your lower back bends more easily: 1. Get on your hands and knees on a firm surface. Keep your hands under your shoulders, and keep your knees under your hips. You may put padding under your knees. 2. Let your head hang down,  and make your tailbone point down to the floor so your lower back is round like the back of a cat. 3. Stay in this position for 5 seconds. 4. Slowly lift your head and make your tailbone point up to the ceiling so your back hangs low (sags) like the back of a cow. 5. Stay in this position for 5 seconds.  Press-Ups Do these steps 5-10 times in a row: 1. Lie on your belly (face-down) on the floor. 2. Place your hands near your head, about shoulder-width apart. 3. While you keep your back relaxed and keep your hips on the floor, slowly straighten your arms to raise the top half of your body and lift your shoulders. Do not use your back muscles. To make yourself more comfortable, you may change where you place your hands. 4. Stay in this position for 5 seconds. 5. Slowly return to lying flat on the floor.  Bridges Do these steps 10 times in a row: 1. Lie on your back on a firm surface. 2. Bend your knees so they point up to the ceiling. Your feet should be flat on the floor. 3. Tighten your butt muscles and lift your butt off of the floor until your waist is almost as high as your knees. If you do not feel the muscles working in your butt and  the back of your thighs, slide your feet 1-2 inches farther away from your butt. 4. Stay in this position for 3-5 seconds. 5. Slowly lower your butt to the floor, and let your butt muscles relax. If this exercise is too easy, try doing it with your arms crossed over your chest. Belly Crunches Do these steps 5-10 times in a row: 1. Lie on your back on a firm bed or the floor with your legs stretched out. 2. Bend your knees so they point up to the ceiling. Your feet should be flat on the floor. 3. Cross your arms over your chest. 4. Tip your chin a little bit toward your chest but do not bend your neck. 5. Tighten your belly muscles and slowly raise your chest just enough to lift your shoulder blades a tiny bit off of the floor. 6. Slowly lower your chest  and your head to the floor. Back Lifts Do these steps 5-10 times in a row: 1. Lie on your belly (face-down) with your arms at your sides, and rest your forehead on the floor. 2. Tighten the muscles in your legs and your butt. 3. Slowly lift your chest off of the floor while you keep your hips on the floor. Keep the back of your head in line with the curve in your back. Look at the floor while you do this. 4. Stay in this position for 3-5 seconds. 5. Slowly lower your chest and your face to the floor. Contact a doctor if:  Your back pain gets a lot worse when you do an exercise.  Your back pain does not lessen 2 hours after you exercise. If you have any of these problems, stop doing the exercises. Do not do them again unless your doctor says it is okay. Get help right away if:  You have sudden, very bad back pain. If this happens, stop doing the exercises. Do not do them again unless your doctor says it is okay. This information is not intended to replace advice given to you by your health care provider. Make sure you discuss any questions you have with your health care provider. Document Released: 12/29/2010 Document Revised: 08/20/2018 Document Reviewed: 01/20/2015 Elsevier Interactive Patient Education  [employer].

## 2019-02-04 ENCOUNTER — Ambulatory Visit (INDEPENDENT_AMBULATORY_CARE_PROVIDER_SITE_OTHER): Payer: 59 | Admitting: Nurse Practitioner

## 2019-02-04 ENCOUNTER — Encounter: Payer: Self-pay | Admitting: Nurse Practitioner

## 2019-02-04 ENCOUNTER — Other Ambulatory Visit (INDEPENDENT_AMBULATORY_CARE_PROVIDER_SITE_OTHER): Payer: 59

## 2019-02-04 VITALS — BP 130/80 | HR 75 | Ht 69.0 in | Wt 291.0 lb

## 2019-02-04 DIAGNOSIS — Z125 Encounter for screening for malignant neoplasm of prostate: Secondary | ICD-10-CM | POA: Diagnosis not present

## 2019-02-04 DIAGNOSIS — Z114 Encounter for screening for human immunodeficiency virus [HIV]: Secondary | ICD-10-CM

## 2019-02-04 DIAGNOSIS — Z0000 Encounter for general adult medical examination without abnormal findings: Secondary | ICD-10-CM | POA: Diagnosis not present

## 2019-02-04 DIAGNOSIS — R7309 Other abnormal glucose: Secondary | ICD-10-CM | POA: Diagnosis not present

## 2019-02-04 DIAGNOSIS — E7800 Pure hypercholesterolemia, unspecified: Secondary | ICD-10-CM

## 2019-02-04 LAB — CBC
HCT: 40.6 % (ref 39.0–52.0)
Hemoglobin: 13.5 g/dL (ref 13.0–17.0)
MCHC: 33.2 g/dL (ref 30.0–36.0)
MCV: 86.7 fl (ref 78.0–100.0)
Platelets: 241 10*3/uL (ref 150.0–400.0)
RBC: 4.68 Mil/uL (ref 4.22–5.81)
RDW: 13.4 % (ref 11.5–15.5)
WBC: 8.4 10*3/uL (ref 4.0–10.5)

## 2019-02-04 LAB — COMPREHENSIVE METABOLIC PANEL
ALT: 29 U/L (ref 0–53)
AST: 23 U/L (ref 0–37)
Albumin: 4.6 g/dL (ref 3.5–5.2)
Alkaline Phosphatase: 72 U/L (ref 39–117)
BILIRUBIN TOTAL: 0.5 mg/dL (ref 0.2–1.2)
BUN: 14 mg/dL (ref 6–23)
CO2: 34 meq/L — AB (ref 19–32)
Calcium: 9.6 mg/dL (ref 8.4–10.5)
Chloride: 102 mEq/L (ref 96–112)
Creatinine, Ser: 1.11 mg/dL (ref 0.40–1.50)
GFR: 84.53 mL/min (ref >60.00)
Glucose, Bld: 98 mg/dL (ref 70–99)
Potassium: 4.5 mEq/L (ref 3.5–5.1)
Sodium: 141 mEq/L (ref 135–145)
Total Protein: 7.4 g/dL (ref 6.0–8.3)

## 2019-02-04 LAB — LIPID PANEL
CHOL/HDL RATIO: 4
Cholesterol: 190 mg/dL (ref 0–200)
HDL: 52.3 mg/dL (ref >39.00)
LDL Cholesterol: 120 mg/dL — ABNORMAL HIGH (ref 0–99)
NonHDL: 137.37
Triglycerides: 86 mg/dL (ref 0.0–149.0)
VLDL: 17.2 mg/dL (ref 0.0–40.0)

## 2019-02-04 LAB — HEMOGLOBIN A1C: Hgb A1c MFr Bld: 6.6 % — ABNORMAL HIGH (ref 4.6–6.5)

## 2019-02-04 NOTE — Patient Instructions (Signed)
Head downstairs for labs today   Health Maintenance, Male A healthy lifestyle and preventive care is important for your health and wellness. Ask your health care provider about what schedule of regular examinations is right for you. What should I know about weight and diet? Eat a Healthy Diet  Eat plenty of vegetables, fruits, whole grains, low-fat dairy products, and lean protein.  Do not eat a lot of foods high in solid fats, added sugars, or salt.  Maintain a Healthy Weight Regular exercise can help you achieve or maintain a healthy weight. You should:  Do at least 150 minutes of exercise each week. The exercise should increase your heart rate and make you sweat (moderate-intensity exercise).  Do strength-training exercises at least twice a week. Watch Your Levels of Cholesterol and Blood Lipids  Have your blood tested for lipids and cholesterol every 5 years starting at 51 years of age. If you are at high risk for heart disease, you should start having your blood tested when you are 51 years old. You may need to have your cholesterol levels checked more often if: ? Your lipid or cholesterol levels are high. ? You are older than 51 years of age. ? You are at high risk for heart disease. What should I know about cancer screening? Many types of cancers can be detected early and may often be prevented. Lung Cancer  You should be screened every year for lung cancer if: ? You are a current smoker who has smoked for at least 30 years. ? You are a former smoker who has quit within the past 15 years.  Talk to your health care provider about your screening options, when you should start screening, and how often you should be screened. Colorectal Cancer  Routine colorectal cancer screening usually begins at 51 years of age and should be repeated every 5-10 years until you are 51 years old. You may need to be screened more often if early forms of precancerous polyps or small growths are  found. Your health care provider may recommend screening at an earlier age if you have risk factors for colon cancer.  Your health care provider may recommend using home test kits to check for hidden blood in the stool.  A small camera at the end of a tube can be used to examine your colon (sigmoidoscopy or colonoscopy). This checks for the earliest forms of colorectal cancer. Prostate and Testicular Cancer  Depending on your age and overall health, your health care provider may do certain tests to screen for prostate and testicular cancer.  Talk to your health care provider about any symptoms or concerns you have about testicular or prostate cancer. Skin Cancer  Check your skin from head to toe regularly.  Tell your health care provider about any new moles or changes in moles, especially if: ? There is a change in a mole's size, shape, or color. ? You have a mole that is larger than a pencil eraser.  Always use sunscreen. Apply sunscreen liberally and repeat throughout the day.  Protect yourself by wearing long sleeves, pants, a wide-brimmed hat, and sunglasses when outside. What should I know about heart disease, diabetes, and high blood pressure?  If you are 69-8 years of age, have your blood pressure checked every 3-5 years. If you are 27 years of age or older, have your blood pressure checked every year. You should have your blood pressure measured twice-once when you are at a hospital or clinic, and once  when you are not at a hospital or clinic. Record the average of the two measurements. To check your blood pressure when you are not at a hospital or clinic, you can use: ? An automated blood pressure machine at a pharmacy. ? A home blood pressure monitor.  Talk to your health care provider about your target blood pressure.  If you are between 64-21 years old, ask your health care provider if you should take aspirin to prevent heart disease.  Have regular diabetes screenings by  checking your fasting blood sugar level. ? If you are at a normal weight and have a low risk for diabetes, have this test once every three years after the age of 84. ? If you are overweight and have a high risk for diabetes, consider being tested at a younger age or more often.  A one-time screening for abdominal aortic aneurysm (AAA) by ultrasound is recommended for men aged 52-75 years who are current or former smokers. What should I know about preventing infection? Hepatitis B If you have a higher risk for hepatitis B, you should be screened for this virus. Talk with your health care provider to find out if you are at risk for hepatitis B infection. Hepatitis C Blood testing is recommended for:  Everyone born from [birth year] through 1963.  Anyone with known risk factors for hepatitis C. Sexually Transmitted Diseases (STDs)  You should be screened each year for STDs including gonorrhea and chlamydia if: ? You are sexually active and are younger than 51 years of age. ? You are older than 51 years of age and your health care provider tells you that you are at risk for this type of infection. ? Your sexual activity has changed since you were last screened and you are at an increased risk for chlamydia or gonorrhea. Ask your health care provider if you are at risk.  Talk with your health care provider about whether you are at high risk of being infected with HIV. Your health care provider may recommend a prescription medicine to help prevent HIV infection. What else can I do?  Schedule regular health, dental, and eye exams.  Stay current with your vaccines (immunizations).  Do not use any tobacco products, such as cigarettes, chewing tobacco, and e-cigarettes. If you need help quitting, ask your health care provider.  Limit alcohol intake to no more than 2 drinks per day. One drink equals 12 ounces of beer, 5 ounces of wine, or 1 ounces of hard liquor.  Do not use street drugs.  Do not  share needles.  Ask your health care provider for help if you need support or information about quitting drugs.  Tell your health care provider if you often feel depressed.  Tell your health care provider if you have ever been abused or do not feel safe at home. This information is not intended to replace advice given to you by your health care provider. Make sure you discuss any questions you have with your health care provider. Document Released: 05/24/2008 Document Revised: 07/25/2016 Document Reviewed: 08/30/2015 Elsevier Interactive Patient Education  2019 [employer].

## 2019-02-04 NOTE — Assessment & Plan Note (Signed)
Reviewed annual screening exams, healthy lifestyle, weight loss, additional information provided on AVS - CBC; Future - Comprehensive metabolic panel; Future - Lipid panel; Future - PSA; Future - Hemoglobin A1c; Future - HIV Antibody (routine testing w rflx); Future  Elevated glucose update labs F/U with further recommendations pending lab results - Comprehensive metabolic panel; Future - Hemoglobin A1c; Future  Screening for HIV (human immunodeficiency virus) - HIV Antibody (routine testing w rflx); Future  Screening for prostate cancer - PSA; Future

## 2019-02-04 NOTE — Assessment & Plan Note (Signed)
Noted in past, not on meds He is fasting Update labs F/U with further recommendations pending lab results - CBC; Future - Comprehensive metabolic panel; Future - Lipid panel; Future

## 2019-02-04 NOTE — Progress Notes (Signed)
Collin Murphy is a 53 y.o. male with the following history as recorded in [facility]:  Patient Active Problem List   Diagnosis Date Noted  . Acute bilateral low back pain without sciatica 09/25/2017  . Bilateral lower extremity edema 06/28/2016  . Obesity, Class III, BMI 40-49.9 (morbid obesity) ([place]) 03/05/2013  . Routine health maintenance 03/05/2013  . Obstructive sleep apnea 09/21/2009  . HYPERCHOLESTEROLEMIA 08/26/2009  . TINEA PEDIS 09/03/2007  . Allergic rhinitis 09/03/2007  . Gastroesophageal reflux disease 09/03/2007    Current Outpatient Medications  Medication Sig Dispense Refill  . cyclobenzaprine (FLEXERIL) 5 MG tablet Take 1 tablet (5 mg total) by mouth 3 (three) times daily as needed for muscle spasms. (Patient not taking: Reported on 02/04/2019) 30 tablet 0  . fluticasone (FLONASE) 50 MCG/ACT nasal spray Place 2 sprays into both nostrils daily. (Patient not taking: Reported on 02/04/2019) 16 g 6  . saccharomyces boulardii (FLORASTOR) 250 MG capsule Take one po BID x 1 month (Patient not taking: Reported on 02/04/2019) 60 capsule 0   Current Facility-Administered Medications  Medication Dose Route Frequency Provider Last Rate Last Dose  . 0.9 %  sodium chloride infusion  500 mL Intravenous Once Pyrtle, Lajuan Lines, MD        Allergies: Patient has no known allergies.  Past Medical History:  Diagnosis Date  . ALLERGIC RHINITIS 09/03/2007  . Allergy   . GERD 09/03/2007  . HYPERCHOLESTEROLEMIA 08/26/2009  . HYPERSOMNIA 08/26/2009  . OBSTRUCTIVE SLEEP APNEA 09/21/2009  . Prostatitis    multiple episodes  . Sleep apnea    CPAP use sometimes  . TINEA PEDIS 09/03/2007    Past Surgical History:  Procedure Laterality Date  . TOE AMPUTATION     3rd and 4th toes amputated after forklift injury    Family History  Problem Relation Age of Onset  . Hypertension Mother   . Heart disease Mother        CAD  . Diabetes Father   . Gout Brother   . COPD Brother   . Cancer Neg Hx         No history Colon, Prostate Breast CA  . Colon cancer Neg Hx   . Esophageal cancer Neg Hx   . Stomach cancer Neg Hx     Social History   Tobacco Use  . Smoking status: Current Some Day Smoker    Types: Cigars  . Smokeless tobacco: Never Used  . Tobacco comment: Very rarely.   Substance Use Topics  . Alcohol use: Yes    Comment: maybe monthly per pt.     Subjective:  Here today for CPE-  Last dental exam: 2019 Last vision exam: 2020 PSA: ordered Colonoscopy: up to date Lipids: lipid panel today DM screening- A1c ordered, elevated glucose readings in the past  Vaccinations: not indicated Diet and exercise: wants to lose weight but no routine diet or exercise at this time  Review of Systems  Constitutional: Negative for chills and fever.  HENT: Negative for hearing loss.   Eyes: Negative for blurred vision and double vision.  Respiratory: Negative for cough and shortness of breath.   Cardiovascular: Negative for chest pain and palpitations.  Gastrointestinal: Negative for abdominal pain, constipation, diarrhea, heartburn, nausea and vomiting.  Genitourinary: Negative for dysuria and hematuria.  Musculoskeletal: Negative for falls.  Skin: Negative for rash.  Neurological: Negative for dizziness, speech change, loss of consciousness, weakness and headaches.  Endo/Heme/Allergies: Does not bruise/bleed easily.  Psychiatric/Behavioral: Negative for depression. The  patient is not nervous/anxious.    Objective:  Vitals:   02/04/19 0839  BP: 130/80  Pulse: 75  SpO2: 95%  Weight: 291 lb (132 kg)  Height: 5\' 9"  (1.753 m)  Body mass index is 42.97 kg/m.  General: Well developed, well nourished, in no acute distress  Skin : Warm and dry.  Head: Normocephalic and atraumatic  Eyes: Sclera and conjunctiva clear; pupils round and reactive to light; extraocular movements intact  Ears: External normal; canals clear; tympanic membranes bulging bilaterally- no erythema or  effusion Oropharynx: Pink, supple. No suspicious lesions  Neck: Supple without thyromegaly, adenopathy  Lungs: Respirations unlabored; clear to auscultation bilaterally without wheeze, rales, rhonchi  CVS exam: normal rate, regular rhythm, normal S1, S2, no murmurs, rubs, clicks or gallops.  Abdomen: Soft; nontender; rotund; no masses or hepatosplenomegaly  Musculoskeletal: No deformities; no active joint inflammation  Extremities: No edema, cyanosis, clubbing  Vessels: Symmetric bilaterally  Neurologic: Alert and oriented; speech intact; face symmetrical; moves all extremities well; CNII-XII intact without focal deficit  Psychiatric: Normal mood and affect.  Assessment:  1. Routine health maintenance   2. HYPERCHOLESTEROLEMIA   3. Elevated glucose   4. Screening for HIV (human immunodeficiency virus)   5. Screening for prostate cancer     Plan:   Return in about 1 year (around 02/05/2020) for cpe.  Orders Placed This Encounter  Procedures  . CBC    Standing Status:   Future    Standing Expiration Date:   02/05/2020  . Comprehensive metabolic panel    Standing Status:   Future    Standing Expiration Date:   02/05/2020  . Lipid panel    Standing Status:   Future    Standing Expiration Date:   02/05/2020  . PSA    Standing Status:   Future    Standing Expiration Date:   02/05/2020  . Hemoglobin A1c    Standing Status:   Future    Standing Expiration Date:   02/05/2020  . HIV Antibody (routine testing w rflx)    Standing Status:   Future    Standing Expiration Date:   03/05/2019    Requested Prescriptions    No prescriptions requested or ordered in this encounter

## 2019-02-05 LAB — HIV ANTIBODY (ROUTINE TESTING W REFLEX): HIV 1&2 Ab, 4th Generation: NONREACTIVE

## 2019-02-05 LAB — PSA: PSA: 0.67 ng/mL (ref 0.10–4.00)

## 2019-02-11 ENCOUNTER — Encounter: Payer: Self-pay | Admitting: Nurse Practitioner

## 2019-02-11 DIAGNOSIS — E119 Type 2 diabetes mellitus without complications: Secondary | ICD-10-CM | POA: Insufficient documentation

## 2019-02-13 ENCOUNTER — Other Ambulatory Visit: Payer: Self-pay | Admitting: Nurse Practitioner

## 2019-02-13 DIAGNOSIS — E7800 Pure hypercholesterolemia, unspecified: Secondary | ICD-10-CM

## 2019-02-13 MED ORDER — ATORVASTATIN CALCIUM 10 MG PO TABS: 10.0000 mg | ORAL_TABLET | Freq: Every day | ORAL | 1 refills | Status: DC

## 2019-02-13 NOTE — Progress Notes (Signed)
lipi

## 2019-02-24 ENCOUNTER — Ambulatory Visit (INDEPENDENT_AMBULATORY_CARE_PROVIDER_SITE_OTHER): Payer: 59 | Admitting: Internal Medicine

## 2019-02-24 ENCOUNTER — Encounter: Payer: Self-pay | Admitting: Internal Medicine

## 2019-02-24 ENCOUNTER — Other Ambulatory Visit: Payer: Self-pay

## 2019-02-24 DIAGNOSIS — J309 Allergic rhinitis, unspecified: Secondary | ICD-10-CM | POA: Diagnosis not present

## 2019-02-24 MED ORDER — FLUTICASONE PROPIONATE 50 MCG/ACT NA SUSP: 2.0000 | Freq: Every day | NASAL | 6 refills | Status: DC

## 2019-02-24 NOTE — Progress Notes (Signed)
   Subjective:   Patient ID: Collin Murphy, male    DOB: [birth year], 53 y.o.   MRN: [id number]  HPI The patient is a 53 y.o. man coming in for cold symptoms. Started 3 days ago. Main symptoms are: sinus drainage, cough with green sputum. Denies fevers, chills, headache, body aches. Overall it is stable since onset. Has tried zicam and nyquil which did help. Denies SOB or breathing problems.   Review of Systems  Constitutional: Negative for activity change, appetite change, chills, fatigue, fever and unexpected weight change.  HENT: Positive for congestion, postnasal drip, rhinorrhea and sinus pressure. Negative for ear discharge, ear pain, sinus pain, sneezing, sore throat, tinnitus, trouble swallowing and voice change.   Eyes: Negative.   Respiratory: Positive for cough. Negative for chest tightness, shortness of breath and wheezing.   Cardiovascular: Negative.   Gastrointestinal: Negative.   Musculoskeletal: Negative.  Negative for myalgias.  Neurological: Negative.     Objective:  Physical Exam Constitutional:      Appearance: He is well-developed.  HENT:     Head: Normocephalic and atraumatic.     Comments: Oropharynx with redness and clear drainage, nose with swollen turbinates, TMs normal bilaterally.  Neck:     Musculoskeletal: Normal range of motion.     Thyroid: No thyromegaly.  Cardiovascular:     Rate and Rhythm: Normal rate and regular rhythm.  Pulmonary:     Effort: Pulmonary effort is normal. No respiratory distress.     Breath sounds: Normal breath sounds. No wheezing or rales.  Abdominal:     Palpations: Abdomen is soft.  Musculoskeletal:        General: No tenderness.  Lymphadenopathy:     Cervical: No cervical adenopathy.  Skin:    General: Skin is warm and dry.  Neurological:     Mental Status: He is alert and oriented to person, place, and time.     Vitals:   02/24/19 1529  BP: 138/80  Pulse: 78  Temp: 98.2 F (36.8 C)  TempSrc: Oral  SpO2:  96%  Weight: 294 lb (133.4 kg)  Height: 5\' 9"  (1.753 m)    Assessment & Plan:

## 2019-02-24 NOTE — Assessment & Plan Note (Signed)
Rx for flonase and advised to use zyrtec otc as well. Can use otc cold medicines if needed.

## 2019-02-24 NOTE — Patient Instructions (Signed)
We have sent in flonase to use 2 sprays in each nostril once a day for the next week or two.

## 2019-03-05 ENCOUNTER — Telehealth: Payer: Self-pay | Admitting: Pulmonary Disease

## 2019-03-05 NOTE — Telephone Encounter (Signed)
Ok noted.  Collin Murphy

## 2019-03-05 NOTE — Telephone Encounter (Signed)
He needs to schedule telephone visit with NP.  He has not been seen since 2017 Based on visit, would be okay to to send new prescription with CPAP of 14 cm

## 2019-03-05 NOTE — Telephone Encounter (Signed)
Called and spoke with patient, he has been scheduled tomorrow with Aaron Edelman at [facility]. Will route this over to [place] as an [place]

## 2019-03-05 NOTE — Telephone Encounter (Signed)
Spoke with pt.  Pt stated he changed insurance from [place] to [facility].  [facility] picked up CPAP since he is no longer with [employer].  Needs a new order for CPAP, Dr Elsworth Soho please advise.

## 2019-03-06 ENCOUNTER — Other Ambulatory Visit: Payer: Self-pay

## 2019-03-06 ENCOUNTER — Encounter: Payer: Self-pay | Admitting: Pulmonary Disease

## 2019-03-06 ENCOUNTER — Ambulatory Visit (INDEPENDENT_AMBULATORY_CARE_PROVIDER_SITE_OTHER): Payer: 59 | Admitting: Pulmonary Disease

## 2019-03-06 DIAGNOSIS — G4733 Obstructive sleep apnea (adult) (pediatric): Secondary | ICD-10-CM | POA: Diagnosis not present

## 2019-03-06 NOTE — Assessment & Plan Note (Signed)
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

## 2019-03-06 NOTE — Progress Notes (Signed)
Virtual Visit via Telephone Note  I connected with Collin Murphy on 03/06/19 at 11:30 AM EDT by telephone and verified that I am speaking with the correct person using two identifiers.   I discussed the limitations, risks, security and privacy concerns of performing an evaluation and management service by telephone and the availability of in person appointments. I also discussed with the patient that there may be a patient responsible charge related to this service. The patient expressed understanding and agreed to proceed.   History of Present Illness:  Patient consented to consult via telephone: yes People present and their role in pt care: Pt   Chief complaint: OSA on CPAP   53 year old male some day smoker (smokes cigars about 2 times a year per patient) reach telephonically today to discuss CPAP use.  There is some confusion regarding patient's CPAP as he reports that he has completed sleep studies for his DOT physical last being around July/2019.  Last sleep study in our system was from 2017 which shows an AHI of 21.    Patient reports that he has not used his CPAP for at least 6 months around July/2019.  We have contacted his DME which is [facility] and they report that his machine is so old he is unable to receive a download from this.  He is hoping to get a new CPAP order.  Patient was last seen in our office in 2017.  Patient reports that he has had worsened daytime fatigue and sleepiness since stopping CPAP.  He reports that his weight has been stable around 294 pounds.  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 The patient was provided an opportunity to ask questions and all were answered. The patient agreed with the plan and demonstrated an understanding of the instructions.   The patient was advised to call back or seek an in-person evaluation if the symptoms worsen or if the condition fails to improve as anticipated.  I provided 23 minutes of non-face-to-face time during this encounter.   Lauraine Rinne, NP

## 2019-03-06 NOTE — Patient Instructions (Signed)
Please contact your DOT office and get your physicals sent to [place] fax number: [phone]    We recommend that you restarting using your CPAP daily >>>Keep up the hard work using your device >>> Goal should be wearing this for the entire night that you are sleeping, at least 4 to 6 hours  Remember:  . Do not drive or operate heavy machinery if tired or drowsy.  . Please notify the supply company and office if you are unable to use your device regularly due to missing supplies or machine being broken.  . Work on maintaining a healthy weight and following your recommended nutrition plan  . Maintain proper daily exercise and movement  . Maintaining proper use of your device can also help improve management of other chronic illnesses such as: Blood pressure, blood sugars, and weight management.   BiPAP/ CPAP Cleaning:  >>>Clean weekly, with Dawn soap, and bottle brush.  Set up to air dry.   Coronavirus (COVID-19) Are you at risk?  Are you at risk for the Coronavirus (COVID-19)?  To be considered HIGH RISK for Coronavirus (COVID-19), you have to meet the following criteria:  . Traveled to [place], [place], [place], [place] or [place]; or in the [place] to [place], [place], [place], or [place]; and have fever, cough, and shortness of breath within the last 2 weeks of travel OR . Been in close contact with a person diagnosed with COVID-19 within the last 2 weeks and have fever, cough, and shortness of breath . IF YOU DO NOT MEET THESE CRITERIA, YOU ARE CONSIDERED LOW RISK FOR COVID-19.  What to do if you are HIGH RISK for COVID-19?  Marland Kitchen If you are having a medical emergency, call 911. . Seek medical care right away. Before you go to a doctor's office, urgent care or emergency department, call ahead and tell them about your recent travel, contact with someone diagnosed with COVID-19, and your symptoms. You should receive instructions from your physician's office regarding next  steps of care.  . When you arrive at healthcare provider, tell the healthcare staff immediately you have returned from visiting [place], [place], [place], [place] or [place]; or traveled in the [place] to [place], [place], [place], or [place]; in the last two weeks or you have been in close contact with a person diagnosed with COVID-19 in the last 2 weeks.   . Tell the health care staff about your symptoms: fever, cough and shortness of breath. . After you have been seen by a medical provider, you will be either: o Tested for (COVID-19) and discharged home on quarantine except to seek medical care if symptoms worsen, and asked to  - Stay home and avoid contact with others until you get your results (4-5 days)  - Avoid travel on public transportation if possible (such as bus, train, or airplane) or o Sent to the Emergency Department by EMS for evaluation, COVID-19 testing, and possible admission depending on your condition and test results.  What to do if you are LOW RISK for COVID-19?  Reduce your risk of any infection by using the same precautions used for avoiding the common cold or flu:  Marland Kitchen Wash your hands often with soap and warm water for at least 20 seconds.  If soap and water are not readily available, use an alcohol-based hand sanitizer with at least 60% alcohol.  . If coughing or sneezing, cover your mouth and nose by coughing or sneezing into  the elbow areas of your shirt or coat, into a tissue or into your sleeve (not your hands). . Avoid shaking hands with others and consider head nods or verbal greetings only. . Avoid touching your eyes, nose, or mouth with unwashed hands.  . Avoid close contact with people who are sick. . Avoid places or events with large numbers of people in one location, like concerts or sporting events. . Carefully consider travel plans you have or are making. . If you are planning any travel outside or inside the [place], visit the CDC's Travelers' Health  webpage for the latest health notices. . If you have some symptoms but not all symptoms, continue to monitor at home and seek medical attention if your symptoms worsen. . If you are having a medical emergency, call 911.   [place] / e-Visit: [email or web address]         [facility]: [facility]: [phone]                   [facility]: [phone]           It is flu season:   >>> Best ways to protect herself from the flu: Receive the yearly flu vaccine, practice good hand hygiene washing with soap and also using hand sanitizer when available, eat a nutritious meals, get adequate rest, hydrate appropriately   Please contact the office if your symptoms worsen or you have concerns that you are not improving.   Thank you for choosing [facility] for your healthcare, and for allowing [place] to partner with you on your healthcare journey. I am thankful to be able to provide care to you today.   Wyn Quaker FNP-C

## 2019-03-10 ENCOUNTER — Telehealth: Payer: Self-pay | Admitting: Pulmonary Disease

## 2019-03-10 DIAGNOSIS — G4733 Obstructive sleep apnea (adult) (pediatric): Secondary | ICD-10-CM

## 2019-03-10 NOTE — Telephone Encounter (Signed)
03/10/2019 1654  Please call the patient let him know that we received the forms from his DOT company.  The form that they send [place] is a CPAP compliance report.  This shows compliance data from July/2019 through August/2019.  This is not a sleep study.  The patient was adamant that he had had a sleep study completed.  This is just simply documentation showing that he had adequate compliance on his CPAP over those 15 days.  CPAP compliance report listed below: 07/01/2018-07/15/2018-CPAP compliance- 14 on the last 15 days use, 12 of those days greater than 4 hours, average usage 5 hours and 57 minutes, CPAP set pressure 14, AHI 0.2  Is this the form the patient thought they were supposed to be sending?  Wyn Quaker, FNP

## 2019-03-11 NOTE — Telephone Encounter (Signed)
Patient states that he only had two sleep studies done and both are in the system. One  On 09/06/2009, Second on 11/21/16. Dot was ment to send the compliance report. He has not had recent sleep study.

## 2019-03-11 NOTE — Telephone Encounter (Signed)
Patient is returning phone call.  Patient phone number is [phone].

## 2019-03-11 NOTE — Telephone Encounter (Signed)
Left VM on cell to call us re: cpap compliance report.  Wyn Quaker FNP received a faxed copy of a cpap compliance report from patient's DOT company.  Patient had stated they should be sending a sleep study.  Would need to verify if this is what we were to expect (the cpap compliance rpt) or if there is still a sleep study we should be receiving.  Need to clarify with patient.  CPAP compliance report is from July 2019 to Aug 2019.  Will await a call back or attempt to call patient again for clarification.

## 2019-03-12 NOTE — Telephone Encounter (Signed)
Thank you.  That is perfect. Collin Murphy

## 2019-03-12 NOTE — Telephone Encounter (Signed)
Spoke to patient about this. Saw in chart that pt had sleep study done in 2017. Per Elsworth Soho in phone note 03/05/2019, we could still send a new prescription for CPAP of 14 cm with 2017 sleep study. For now, order is placed to [facility] to replace old CPAP of 14 cm with mask of choice, supplies, [employer]/card. I also put in a recall for 3 mo w/ RA to f/u on compliance with CPAP. Let pt know if he doesn't receive CPAP within the next week or so to give [place] a call. Nothing further needed at this time.

## 2019-03-12 NOTE — Telephone Encounter (Signed)
Okay please contact the patient and let him know that we will try to order a new CPAP with the same settings.  This may or may not be improved from his insurance company as well as from the [place].  We will try to get this processed for him.  If this is denied they may require an additional sleep study to be completed.  Which those are currently on hold right now due to COVID-19.  Please reschedule his 4-week follow-up for a 30-month follow-up as he is starting CPAP again and this can check compliance.  He needs to show at least 30 days compliance to CPAP at that time.  Wyn Quaker, FNP

## 2019-03-23 ENCOUNTER — Telehealth: Payer: Self-pay

## 2019-03-23 NOTE — Telephone Encounter (Signed)
Spoke with patient.  Needs to be moved to 05/13/19 @ 840 am appointment.

## 2019-03-25 ENCOUNTER — Encounter: Payer: 59 | Admitting: Family

## 2019-03-27 DIAGNOSIS — J1289 Other viral pneumonia: Secondary | ICD-10-CM | POA: Diagnosis not present

## 2019-04-08 ENCOUNTER — Ambulatory Visit: Payer: 59 | Admitting: Pulmonary Disease

## 2019-04-29 ENCOUNTER — Ambulatory Visit: Payer: 59 | Admitting: Acute Care

## 2019-05-13 ENCOUNTER — Encounter: Payer: 59 | Admitting: Family

## 2019-05-31 ENCOUNTER — Emergency Department (HOSPITAL_COMMUNITY)
Admission: EM | Admit: 2019-05-31 | Discharge: 2019-05-31 | Disposition: A | Discharge status: Home / Self Care | Payer: 59 | Attending: Emergency Medicine | Admitting: Emergency Medicine

## 2019-05-31 ENCOUNTER — Other Ambulatory Visit: Payer: Self-pay

## 2019-05-31 DIAGNOSIS — F17290 Nicotine dependence, other tobacco product, uncomplicated: Secondary | ICD-10-CM | POA: Diagnosis not present

## 2019-05-31 DIAGNOSIS — R0981 Nasal congestion: Secondary | ICD-10-CM | POA: Diagnosis not present

## 2019-05-31 DIAGNOSIS — R42 Dizziness and giddiness: Secondary | ICD-10-CM | POA: Insufficient documentation

## 2019-05-31 DIAGNOSIS — Z89429 Acquired absence of other toe(s), unspecified side: Secondary | ICD-10-CM | POA: Insufficient documentation

## 2019-05-31 DIAGNOSIS — Z79899 Other long term (current) drug therapy: Secondary | ICD-10-CM | POA: Insufficient documentation

## 2019-05-31 DIAGNOSIS — E119 Type 2 diabetes mellitus without complications: Secondary | ICD-10-CM | POA: Diagnosis not present

## 2019-05-31 LAB — CBC
HCT: 43 % (ref 39.0–52.0)
Hemoglobin: 13.5 g/dL (ref 13.0–17.0)
MCH: 28.3 pg (ref 26.0–34.0)
MCHC: 31.4 g/dL (ref 30.0–36.0)
MCV: 90.1 fL (ref 80.0–100.0)
Platelets: 241 10*3/uL (ref 150–400)
RBC: 4.77 MIL/uL (ref 4.22–5.81)
RDW: 12.9 % (ref 11.5–15.5)
WBC: 8.3 10*3/uL (ref 4.0–10.5)
nRBC: 0 % (ref 0.0–0.2)

## 2019-05-31 LAB — BASIC METABOLIC PANEL
Anion gap: 10 (ref 5–15)
BUN: 12 mg/dL (ref 6–20)
CO2: 28 mmol/L (ref 22–32)
Calcium: 9.3 mg/dL (ref 8.9–10.3)
Chloride: 100 mmol/L (ref 98–111)
Creatinine, Ser: 1.06 mg/dL (ref 0.61–1.24)
GFR calc Af Amer: >60 mL/min (ref >60)
GFR calc non Af Amer: >60 mL/min (ref >60)
Glucose, Bld: 114 mg/dL — ABNORMAL HIGH (ref 70–99)
Potassium: 3.7 mmol/L (ref 3.5–5.1)
Sodium: 138 mmol/L (ref 135–145)

## 2019-05-31 MED ORDER — PSEUDOEPHEDRINE HCL ER 120 MG PO TB12: 120.0000 mg | ORAL_TABLET | Freq: Two times a day (BID) | ORAL | 0 refills | Status: DC

## 2019-05-31 NOTE — Discharge Instructions (Addendum)
Take the sudafed to help with the sinus congestion, continue your flonase, monitor for fever, worsening symptoms

## 2019-05-31 NOTE — ED Provider Notes (Signed)
[facility] DEPT Provider Note   CSN: [id number] Arrival date & time: 05/31/19  1830    History   Chief Complaint Chief Complaint  Patient presents with  . Cough  . Facial Pain  . Nasal Congestion    HPI Collin Murphy is a 53 y.o. male.     HPI Patient presents the emergency room for evaluation of sinus inflammation.  Patient states he started having trouble with sinus congestion and lightheadedness several days ago.  He went to see his doctor and was started on sinus medications as well as a nasal steroid.  Patient states he is not feeling any better over the last couple days.  He does feel lightheaded still.  He denies any trouble with his speech or his vision.  He is not having any focal numbness or weakness.  No ataxia.  No fevers or chills. Past Medical History:  Diagnosis Date  . ALLERGIC RHINITIS 09/03/2007  . Allergy   . GERD 09/03/2007  . HYPERCHOLESTEROLEMIA 08/26/2009  . HYPERSOMNIA 08/26/2009  . OBSTRUCTIVE SLEEP APNEA 09/21/2009  . Prostatitis    multiple episodes  . Sleep apnea    CPAP use sometimes  . TINEA PEDIS 09/03/2007    Patient Active Problem List   Diagnosis Date Noted  . Diabetes mellitus without complication ([place]) 70/62/3762  . Acute bilateral low back pain without sciatica 09/25/2017  . Bilateral lower extremity edema 06/28/2016  . Obesity, Class III, BMI 40-49.9 (morbid obesity) ([place]) 03/05/2013  . Routine health maintenance 03/05/2013  . Obstructive sleep apnea 09/21/2009  . HYPERCHOLESTEROLEMIA 08/26/2009  . TINEA PEDIS 09/03/2007  . Allergic rhinitis 09/03/2007  . Gastroesophageal reflux disease 09/03/2007    Past Surgical History:  Procedure Laterality Date  . TOE AMPUTATION     3rd and 4th toes amputated after forklift injury        Home Medications    Prior to Admission medications   Medication Sig Start Date End Date Taking? Authorizing Provider  atorvastatin (LIPITOR) 10 MG tablet Take 1  tablet (10 mg total) by mouth daily. 02/13/19   Lance Sell, NP  cyclobenzaprine (FLEXERIL) 5 MG tablet Take 1 tablet (5 mg total) by mouth 3 (three) times daily as needed for muscle spasms. Patient not taking: Reported on 02/04/2019 11/26/18   Lance Sell, NP  fluticasone (FLONASE) 50 MCG/ACT nasal spray Place 2 sprays into both nostrils daily. 02/24/19   Hoyt Koch, MD  pseudoephedrine (SUDAFED 12 HOUR) 120 MG 12 hr tablet Take 1 tablet (120 mg total) by mouth every 12 (twelve) hours. 05/31/19   Dorie Rank, MD    Family History Family History  Problem Relation Age of Onset  . Hypertension Mother   . Heart disease Mother        CAD  . Diabetes Father   . Gout Brother   . COPD Brother   . Cancer Neg Hx        No history Colon, Prostate Breast CA  . Colon cancer Neg Hx   . Esophageal cancer Neg Hx   . Stomach cancer Neg Hx     Social History Social History   Tobacco Use  . Smoking status: Current Some Day Smoker    Types: Cigars  . Smokeless tobacco: Never Used  . Tobacco comment: Very rarely. - 2x ayear   Substance Use Topics  . Alcohol use: Yes    Comment: maybe monthly per pt.  . Drug use: No  Allergies   Patient has no known allergies.   Review of Systems Review of Systems  All other systems reviewed and are negative.    Physical Exam Updated Vital Signs BP (!) 148/89 (BP Location: Left Arm)   Pulse 74   Temp 99.8 F (37.7 C) (Oral)   Resp 17   SpO2 97%   Physical Exam Vitals signs and nursing note reviewed.  Constitutional:      General: He is not in acute distress.    Appearance: He is well-developed.  HENT:     Head: Normocephalic and atraumatic.     Comments: No significant sinus tenderness    Right Ear: Tympanic membrane and external ear normal.     Left Ear: Tympanic membrane and external ear normal.     Nose:     Comments: No nasal discharge Eyes:     General: No scleral icterus.       Right eye: No discharge.         Left eye: No discharge.     Conjunctiva/sclera: Conjunctivae normal.  Neck:     Musculoskeletal: Neck supple.     Trachea: No tracheal deviation.  Cardiovascular:     Rate and Rhythm: Normal rate and regular rhythm.  Pulmonary:     Effort: Pulmonary effort is normal. No respiratory distress.     Breath sounds: Normal breath sounds. No stridor. No wheezing or rales.  Abdominal:     General: Bowel sounds are normal. There is no distension.     Palpations: Abdomen is soft.     Tenderness: There is no abdominal tenderness. There is no guarding or rebound.  Musculoskeletal:        General: No tenderness.  Skin:    General: Skin is warm and dry.     Findings: No rash.  Neurological:     Mental Status: He is alert.     Cranial Nerves: No cranial nerve deficit (no facial droop, extraocular movements intact, no slurred speech).     Sensory: No sensory deficit.     Motor: No abnormal muscle tone or seizure activity.     Coordination: Coordination normal.      ED Treatments / Results  Labs (all labs ordered are listed, but only abnormal results are displayed) Labs Reviewed  BASIC METABOLIC PANEL - Abnormal; Notable for the following components:      Result Value   Glucose, Bld 114 (*)    All other components within normal limits  CBC    EKG None  Radiology No results found.  Procedures Procedures (including critical care time)  Medications Ordered in ED Medications - No data to display   Initial Impression / Assessment and Plan / ED Course  I have reviewed the triage vital signs and the nursing notes.  Pertinent labs & imaging results that were available during my care of the patient were reviewed by me and considered in my medical decision making (see chart for details).   Labs are reassuring.  Neuro exam normal.  NO fever.  No focal sinus ttp.  Doubt bacterial sinusitis.  WIll dc home on sudafed.  Continue flonase.  Final Clinical Impressions(s) / ED  Diagnoses   Final diagnoses:  Sinus congestion    ED Discharge Orders         Ordered    pseudoephedrine (SUDAFED 12 HOUR) 120 MG 12 hr tablet  Every 12 hours     05/31/19 2054  Dorie Rank, MD 05/31/19 2055

## 2019-05-31 NOTE — ED Triage Notes (Signed)
Pt c/o sinus pressure, congestion, right ear pain and cough with clear sputum for 2 days.

## 2019-06-01 ENCOUNTER — Ambulatory Visit (INDEPENDENT_AMBULATORY_CARE_PROVIDER_SITE_OTHER): Payer: 59 | Admitting: Internal Medicine

## 2019-06-01 ENCOUNTER — Telehealth: Payer: Self-pay | Admitting: *Deleted

## 2019-06-01 ENCOUNTER — Encounter: Payer: Self-pay | Admitting: Internal Medicine

## 2019-06-01 DIAGNOSIS — G4733 Obstructive sleep apnea (adult) (pediatric): Secondary | ICD-10-CM

## 2019-06-01 DIAGNOSIS — Z20822 Contact with and (suspected) exposure to covid-19: Secondary | ICD-10-CM

## 2019-06-01 DIAGNOSIS — E119 Type 2 diabetes mellitus without complications: Secondary | ICD-10-CM | POA: Diagnosis not present

## 2019-06-01 DIAGNOSIS — J069 Acute upper respiratory infection, unspecified: Secondary | ICD-10-CM

## 2019-06-01 MED ORDER — AZITHROMYCIN 250 MG PO TABS: ORAL_TABLET | ORAL | 1 refills | Status: DC

## 2019-06-01 MED ORDER — TRAMADOL HCL 50 MG PO TABS: 50.0000 mg | ORAL_TABLET | Freq: Four times a day (QID) | ORAL | 0 refills | Status: DC | PRN

## 2019-06-01 NOTE — Assessment & Plan Note (Signed)
stable overall by history and exam, recent data reviewed with pt, and pt to continue medical treatment as before,  to f/u any worsening symptoms or concerns, to f/u with Jodi Mourning NP whom I believe is the new PCP

## 2019-06-01 NOTE — Telephone Encounter (Signed)
Contacted patient, scheduled him for COVID 19 testing tomorrow morning at 8 am at [facility].  Testing protocol reviewed with patient, he expressed understanding.

## 2019-06-01 NOTE — Patient Instructions (Signed)
Please take all new medication as prescribed - the antibiotic, and pain medication (which also helps with cough)  You will be contacted regarding the referral for: COVID testing  Please continue all other medications as before, and refills have been done if requested.  Please have the pharmacy call with any other refills you may need.  Please keep your appointments with your specialists as you may have planned

## 2019-06-01 NOTE — Assessment & Plan Note (Signed)
To f/u with Pulm June 24,  to f/u any worsening symptoms or concerns

## 2019-06-01 NOTE — Telephone Encounter (Signed)
-----   Message from Cresenciano Lick, [place] sent at 06/01/2019  4:06 PM EDT ----- Regarding: COVID19 Patient needs testing per Dr. Cathlean Cower. Thanks!

## 2019-06-01 NOTE — Assessment & Plan Note (Signed)
C/w infectious, ? Viral vs other, will refer for COVID testing to the [facility], for tramadol for pain prn,  to f/u any worsening symptoms or concerns

## 2019-06-01 NOTE — Progress Notes (Signed)
Patient ID: Collin Murphy, male   DOB: 1966/05/08, 53 y.o.   MRN: [id number]  Virtual Visit via Video Note  I connected with Collin Murphy on 06/01/19 at  3:20 PM EDT by a video enabled telemedicine application and verified that I am speaking with the correct person using two identifiers.  Location: Patient: at home Provider: at office   I discussed the limitations of evaluation and management by telemedicine and the availability of in person appointments. The patient expressed understanding and agreed to proceed.  History of Present Illness:  Here with 2-3 days acute onset fever, facial and bilat ear and sinus pain, pressure, headache, general weakness and malaise,, with clear congstion and d/c, without ST,  cough, and pt denies chest pain, wheezing, increased sob or doe, orthopnea, PND, increased LE swelling, palpitations, dizziness or syncope.  Marland KitchenHA pain is about 8/10 .  Has pulm appt June 24, asking about COVID testing.  Seen yesterday in ED with sudafed tx, but no better today.   Pt denies polydipsia, polyuria  Past Medical History:  Diagnosis Date  . ALLERGIC RHINITIS 09/03/2007  . Allergy   . GERD 09/03/2007  . HYPERCHOLESTEROLEMIA 08/26/2009  . HYPERSOMNIA 08/26/2009  . OBSTRUCTIVE SLEEP APNEA 09/21/2009  . Prostatitis    multiple episodes  . Sleep apnea    CPAP use sometimes  . TINEA PEDIS 09/03/2007   Past Surgical History:  Procedure Laterality Date  . TOE AMPUTATION     3rd and 4th toes amputated after forklift injury    reports that he has been smoking cigars. He has never used smokeless tobacco. He reports current alcohol use. He reports that he does not use drugs. family history includes COPD in his brother; Diabetes in his father; Gout in his brother; Heart disease in his mother; Hypertension in his mother. No Known Allergies Current Outpatient Medications on File Prior to Visit  Medication Sig Dispense Refill  . atorvastatin (LIPITOR) 10 MG tablet Take 1 tablet  (10 mg total) by mouth daily. 30 tablet 1  . cyclobenzaprine (FLEXERIL) 5 MG tablet Take 1 tablet (5 mg total) by mouth 3 (three) times daily as needed for muscle spasms. 30 tablet 0  . fluticasone (FLONASE) 50 MCG/ACT nasal spray Place 2 sprays into both nostrils daily. 16 g 6  . pseudoephedrine (SUDAFED 12 HOUR) 120 MG 12 hr tablet Take 1 tablet (120 mg total) by mouth every 12 (twelve) hours. 14 tablet 0   No current facility-administered medications on file prior to visit.     Observations/Objective: Alert, NAD, mild ill, appropriate mood and affect, resps normal, cn 2-12 intact, moves all 4s, no visible rash or swelling Lab Results  Component Value Date   WBC 8.3 05/31/2019   HGB 13.5 05/31/2019   HCT 43.0 05/31/2019   PLT 241 05/31/2019   GLUCOSE 114 (H) 05/31/2019   CHOL 190 02/04/2019   TRIG 86.0 02/04/2019   HDL 52.30 02/04/2019   LDLDIRECT 154.5 09/03/2007   LDLCALC 120 (H) 02/04/2019   ALT 29 02/04/2019   AST 23 02/04/2019   NA 138 05/31/2019   K 3.7 05/31/2019   CL 100 05/31/2019   CREATININE 1.06 05/31/2019   BUN 12 05/31/2019   CO2 28 05/31/2019   TSH 1.78 09/03/2007   PSA 0.67 02/04/2019   HGBA1C 6.6 (H) 02/04/2019   Assessment and Plan: See notes  Follow Up Instructions: Seen notes   I discussed the assessment and treatment plan with the patient. The patient was  provided an opportunity to ask questions and all were answered. The patient agreed with the plan and demonstrated an understanding of the instructions.   The patient was advised to call back or seek an in-person evaluation if the symptoms worsen or if the condition fails to improve as anticipated.   Cathlean Cower, MD

## 2019-06-02 ENCOUNTER — Other Ambulatory Visit: Payer: 59

## 2019-06-02 DIAGNOSIS — Z20822 Contact with and (suspected) exposure to covid-19: Secondary | ICD-10-CM

## 2019-06-03 ENCOUNTER — Ambulatory Visit: Payer: 59 | Admitting: Acute Care

## 2019-06-05 ENCOUNTER — Ambulatory Visit: Payer: Self-pay | Admitting: *Deleted

## 2019-06-05 ENCOUNTER — Telehealth: Payer: Self-pay

## 2019-06-05 LAB — NOVEL CORONAVIRUS, NAA: SARS-CoV-2, NAA: DETECTED — AB

## 2019-06-05 NOTE — Telephone Encounter (Signed)
Message from Luciana Axe sent at 06/05/2019 4:56 PM EDT  Raquel Sarna from [employer] is calling to notify that patient has positive COVID [phone] option 1    Pt notified of positive result. See telephone encounter from 06/05/19.

## 2019-06-05 NOTE — Telephone Encounter (Signed)
Faxed positive COVID results to [facility] At [phone].

## 2019-06-05 NOTE — Telephone Encounter (Signed)
Pt called for his COVID-19 result Pt informed he was positive and that he could give the germ to others. He states that he has no taste or smell. He has no fever. He was told to quarantine 14 days and return call to office for worsening symptoms. Pt is concerned about his wife and family. He was told call there PCP or go to urgent care after notifying them of possible exposure.

## 2019-06-15 ENCOUNTER — Encounter: Payer: Self-pay | Admitting: Internal Medicine

## 2019-06-15 ENCOUNTER — Telehealth: Payer: Self-pay | Admitting: Emergency Medicine

## 2019-06-15 NOTE — Telephone Encounter (Signed)
Pt called to ask if he should be retested before returning to work, he has been on quarantine for 14 days. Pt is having very occasional sneezing, sinus pressure, feeling like his nose is clogged. Still feeling weak, low on energy. Also has diarrhea after taking nyquil. Would like to know if  flonase be used, pt has had sinus issues for years. Pt plans to go to work tomorrow, please advise if he should stay out longer?

## 2019-06-15 NOTE — Telephone Encounter (Signed)
Ok to return to work but no sooner than 7 days after resolution of the symptoms that were directely from the [place]

## 2019-06-15 NOTE — Telephone Encounter (Signed)
Spoke with pt to inform. Fax number was given for employer, Letter has been typed to faxed to employer.

## 2019-06-30 NOTE — Progress Notes (Signed)
History of Present Illness Collin Murphy is a 53 y.o. male with OSA, non-compliant with CPAP. He is followed by Collin Murphy.  53 year old male some day smoker (smokes cigars about 2 times a year per patient)  Pt. States he was COVID + the first of June 2020, but he is now COVID negative. His symptoms were mild. He experience loss of taste and smell, and had headache with sinus infection. It lasted a total of 10 days.   07/01/2019 CPAP Follow up:  Follow up for CPAP use for OSA. Pt had a televisit with Collin Murphy 03/06/2019. 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   Last sleep study in our system was from 2017 which shows an AHI of 21.  Patient reported that he has not used his CPAP for at least 6 months. Last  around July/2019.  His DME was contacted ([facility] ), and they report that his machine is so old he is unable to receive a download .Marland Kitchen  Pt.  Was  hoping to get a new CPAP order.  Patient was last seen in our office in 2017.  Patient reports that he has had worsened daytime fatigue and sleepiness since stopping CPAP.  He reports that his weight has been stable around 294 pounds.   Pt. Presents today pt received his new machine 05/02/2019. He has not been compliant with use. He is being followed by a DOT physician , and he knows he must increase his compliance to get his DOT license. He was given his license despite this. The MD at DOT wants him to use for an additional 30 days and then follow up with the DOT physician. He states he has been doing better for the last 5 days. He does have daytime sleepiness. He does notice that with use of CPAP his daytime sleepiness is resolved. He has all of the supplies he needs. We discussed the need for compliance with therapy. I explained that when he wears his CPAP machine his OSA is well controlled. We reviewed risks of  untreated OSA.   Test Results: Download 07/01/2019 06/01/2019 through 06/30/2019 Usage days 16 of 30 or 53% Greater than 4 hours 13 days or 43% Less than 4 hours 3 days or 10% Air sense 10 AutoSet CPAP mode Set pressure 14 cm H2O AHI is 0.5  CBC Latest Ref Rng & Units 05/31/2019 02/04/2019 09/03/2007  WBC 4.0 - 10.5 K/uL 8.3 8.4 8.2  Hemoglobin 13.0 - 17.0 g/dL 13.5 13.5 14.2  Hematocrit 39.0 - 52.0 % 43.0 40.6 42.4  Platelets 150 - 400 K/uL 241 241.0 269    BMP Latest Ref Rng & Units 05/31/2019 02/04/2019 08/26/2009  Glucose 70 - 99 mg/dL 114(H) 98 150(H)  BUN 6 - 20 mg/dL 12 14 13   Creatinine 0.61 - 1.24 mg/dL 1.06 1.11 1.3  Sodium 135 - 145 mmol/L 138 141 142  Potassium 3.5 - 5.1 mmol/L 3.7 4.5 3.7  Chloride 98 - 111 mmol/L 100 102 107  CO2 22 - 32 mmol/L 28 34(H) 32  Calcium 8.9 - 10.3 mg/dL 9.3 9.6 9.2    BNP No results found for: BNP  ProBNP No results found for: PROBNP  PFT No results found for: FEV1PRE, FEV1POST, FVCPRE, FVCPOST, TLC, DLCOUNC, PREFEV1FVCRT, PSTFEV1FVCRT  No results found.   Past medical hx Past Medical History:  Diagnosis Date   ALLERGIC RHINITIS  09/03/2007   Allergy    GERD 09/03/2007   HYPERCHOLESTEROLEMIA 08/26/2009   HYPERSOMNIA 08/26/2009   OBSTRUCTIVE SLEEP APNEA 09/21/2009   Prostatitis    multiple episodes   Sleep apnea    CPAP use sometimes   TINEA PEDIS 09/03/2007     Social History   Tobacco Use   Smoking status: Current Some Day Smoker    Types: Cigars   Smokeless tobacco: Never Used   Tobacco comment: Very rarely. - 2x ayear   Substance Use Topics   Alcohol use: Yes    Comment: maybe monthly per pt.   Drug use: No    Collin Murphy reports that he has been smoking cigars. He has never used smokeless tobacco. He reports current alcohol use. He reports that he does not use drugs.  Tobacco Cessation: Current Some Day smoker: Smokes Cigars 2 x per year  Past surgical hx, Family hx, Social hx all  reviewed.  Current Outpatient Medications on File Prior to Visit  Medication Sig   atorvastatin (LIPITOR) 10 MG tablet Take 1 tablet (10 mg total) by mouth daily.   cyclobenzaprine (FLEXERIL) 5 MG tablet Take 1 tablet (5 mg total) by mouth 3 (three) times daily as needed for muscle spasms.   fluticasone (FLONASE) 50 MCG/ACT nasal spray Place 2 sprays into both nostrils daily.   pseudoephedrine (SUDAFED 12 HOUR) 120 MG 12 hr tablet Take 1 tablet (120 mg total) by mouth every 12 (twelve) hours.   traMADol (ULTRAM) 50 MG tablet Take 1 tablet (50 mg total) by mouth every 6 (six) hours as needed.   No current facility-administered medications on file prior to visit.      No Known Allergies  Review Of Systems:  Constitutional:   No  weight loss, night sweats,  Fevers, chills, fatigue, or  lassitude.  HEENT:   No headaches,  Difficulty swallowing,  Tooth/dental problems, or  Sore throat,                No sneezing, itching, ear ache, nasal congestion, post nasal drip,   CV:  No chest pain,  Orthopnea, PND, swelling in lower extremities, anasarca, dizziness, palpitations, syncope.   GI  No heartburn, indigestion, abdominal pain, nausea, vomiting, diarrhea, change in bowel habits, loss of appetite, bloody stools.   Resp: No shortness of breath with exertion or at rest.  No excess mucus, no productive cough,  No non-productive cough,  No coughing up of blood.  No change in color of mucus.  No wheezing.  No chest wall deformity  Skin: no rash or lesions.  GU: no dysuria, change in color of urine, no urgency or frequency.  No flank pain, no hematuria   MS:  No joint pain or swelling.  No decreased range of motion.  No back pain.  Psych:  No change in mood or affect. No depression or anxiety.  No memory loss.   Vital Signs BP 140/78 (BP Location: Left Arm, Cuff Size: Large)    Pulse 90    Temp 98.3 F (36.8 C) (Oral)    Ht 5' 9.5" (1.765 m)    Wt 279 lb 12.8 oz (126.9 kg)    SpO2 97%     BMI 40.73 kg/m    Physical Exam:  General- No distress,  A&Ox3, pleasant ENT: No sinus tenderness, TM clear, pale nasal mucosa, no oral exudate,no post nasal drip, no LAN Cardiac: S1, S2, regular rate and rhythm, no murmur Chest: No wheeze/ rales/ dullness; no accessory muscle  use, no nasal flaring, no sternal retractions Abd.: Soft Non-tender, ND, BS +, Body mass index is 40.73 kg/m. Ext: No clubbing cyanosis, edema Neuro:  normal strength, MAE x 4, A&O x 3, appropriate Skin: No rashes,No lesions,  warm and dry Psych: normal mood and behavior   Assessment/Plan  Obstructive sleep apnea On CPAP Needs to improve compliance Plan Continue on CPAP at bedtime. You appear to be benefiting from the treatment  Goal is to wear for at least 6 hours each night for maximal clinical benefit. Continue to work on weight loss, as the link between excess weight  and sleep apnea is well established.   Remember to establish a good bedtime routine, and work on sleep hygiene.  Limit daytime naps , avoid stimulants such as caffeine and nicotine close to bedtime, exercise daily to promote sleep quality, avoid heavy , spicy, fried , or rich foods before bed. Ensure adequate exposure to natural light during the day,establish a relaxing bedtime routine with a pleasant sleep environment ( Bedroom between 60 and 67 degrees, turn off bright lights , TV or device screens screens , consider black out curtains or white noise machines) Do not drive if sleepy. Remember to clean mask, tubing, filter, and reservoir once weekly with soapy water.  Follow up with Collin Murphy or Judson Roch NP    In 3 months  or before as needed.   Follow up with your PCP regarding you blood pressure  Please contact office for sooner follow up if symptoms do not improve or worsen or seek emergency care    Pt. Had COVID in early June 2020  Magdalen Spatz, NP 07/01/2019  3:52 PM

## 2019-07-01 ENCOUNTER — Other Ambulatory Visit: Payer: Self-pay

## 2019-07-01 ENCOUNTER — Ambulatory Visit (INDEPENDENT_AMBULATORY_CARE_PROVIDER_SITE_OTHER): Payer: 59 | Admitting: Acute Care

## 2019-07-01 ENCOUNTER — Encounter: Payer: Self-pay | Admitting: Acute Care

## 2019-07-01 DIAGNOSIS — G4733 Obstructive sleep apnea (adult) (pediatric): Secondary | ICD-10-CM | POA: Diagnosis not present

## 2019-07-01 NOTE — Patient Instructions (Addendum)
It is good to meet you today Continue on CPAP at bedtime. You appear to be benefiting from the treatment  Goal is to wear for at least 6 hours each night for maximal clinical benefit. Continue to work on weight loss, as the link between excess weight  and sleep apnea is well established.   Remember to establish a good bedtime routine, and work on sleep hygiene.  Limit daytime naps , avoid stimulants such as caffeine and nicotine close to bedtime, exercise daily to promote sleep quality, avoid heavy , spicy, fried , or rich foods before bed. Ensure adequate exposure to natural light during the day,establish a relaxing bedtime routine with a pleasant sleep environment ( Bedroom between 60 and 67 degrees, turn off bright lights , TV or device screens screens , consider black out curtains or white noise machines) Do not drive if sleepy. Remember to clean mask, tubing, filter, and reservoir once weekly with soapy water.  Follow up with Dr. Elsworth Soho or Judson Roch NP    In 3 months  or before as needed.   Follow up with your PCP regarding you blood pressure  Please contact office for sooner follow up if symptoms do not improve or worsen or seek emergency care

## 2019-07-01 NOTE — Assessment & Plan Note (Signed)
On CPAP Needs to improve compliance Plan Continue on CPAP at bedtime. You appear to be benefiting from the treatment  Goal is to wear for at least 6 hours each night for maximal clinical benefit. Continue to work on weight loss, as the link between excess weight  and sleep apnea is well established.   Remember to establish a good bedtime routine, and work on sleep hygiene.  Limit daytime naps , avoid stimulants such as caffeine and nicotine close to bedtime, exercise daily to promote sleep quality, avoid heavy , spicy, fried , or rich foods before bed. Ensure adequate exposure to natural light during the day,establish a relaxing bedtime routine with a pleasant sleep environment ( Bedroom between 60 and 67 degrees, turn off bright lights , TV or device screens screens , consider black out curtains or white noise machines) Do not drive if sleepy. Remember to clean mask, tubing, filter, and reservoir once weekly with soapy water.  Follow up with Dr. Elsworth Soho or Judson Roch NP    In 3 months  or before as needed.   Follow up with your PCP regarding you blood pressure  Please contact office for sooner follow up if symptoms do not improve or worsen or seek emergency care

## 2019-07-08 ENCOUNTER — Other Ambulatory Visit: Payer: Self-pay

## 2019-07-08 ENCOUNTER — Ambulatory Visit (INDEPENDENT_AMBULATORY_CARE_PROVIDER_SITE_OTHER): Payer: 59 | Admitting: Family

## 2019-07-08 ENCOUNTER — Other Ambulatory Visit (INDEPENDENT_AMBULATORY_CARE_PROVIDER_SITE_OTHER): Payer: 59

## 2019-07-08 ENCOUNTER — Encounter: Payer: Self-pay | Admitting: Family

## 2019-07-08 VITALS — BP 138/80 | HR 75 | Temp 97.9°F | Ht 69.5 in | Wt 284.0 lb

## 2019-07-08 DIAGNOSIS — G4733 Obstructive sleep apnea (adult) (pediatric): Secondary | ICD-10-CM

## 2019-07-08 DIAGNOSIS — J329 Chronic sinusitis, unspecified: Secondary | ICD-10-CM

## 2019-07-08 DIAGNOSIS — E119 Type 2 diabetes mellitus without complications: Secondary | ICD-10-CM

## 2019-07-08 LAB — COMPREHENSIVE METABOLIC PANEL
ALT: 21 U/L (ref 0–53)
AST: 19 U/L (ref 0–37)
Albumin: 4.6 g/dL (ref 3.5–5.2)
Alkaline Phosphatase: 69 U/L (ref 39–117)
BUN: 15 mg/dL (ref 6–23)
CO2: 30 mEq/L (ref 19–32)
Calcium: 9.6 mg/dL (ref 8.4–10.5)
Chloride: 103 mEq/L (ref 96–112)
Creatinine, Ser: 1.01 mg/dL (ref 0.40–1.50)
GFR: 94.1 mL/min (ref >60.00)
Glucose, Bld: 89 mg/dL (ref 70–99)
Potassium: 4 mEq/L (ref 3.5–5.1)
Sodium: 139 mEq/L (ref 135–145)
Total Bilirubin: 0.5 mg/dL (ref 0.2–1.2)
Total Protein: 7.6 g/dL (ref 6.0–8.3)

## 2019-07-08 LAB — HEMOGLOBIN A1C: Hgb A1c MFr Bld: 6.5 % (ref 4.6–6.5)

## 2019-07-08 LAB — LIPID PANEL
Cholesterol: 179 mg/dL (ref 0–200)
HDL: 49.5 mg/dL (ref >39.00)
LDL Cholesterol: 107 mg/dL — ABNORMAL HIGH (ref 0–99)
NonHDL: 129.44
Total CHOL/HDL Ratio: 4
Triglycerides: 112 mg/dL (ref 0.0–149.0)
VLDL: 22.4 mg/dL (ref 0.0–40.0)

## 2019-07-08 NOTE — Progress Notes (Signed)
Collin Murphy is a 53 y.o. male with the following history as recorded in [employer]:  Patient Active Problem List   Diagnosis Date Noted  . Acute upper respiratory infection 06/01/2019  . Diabetes mellitus without complication ([place]) 14/48/1856  . Acute bilateral low back pain without sciatica 09/25/2017  . Bilateral lower extremity edema 06/28/2016  . Obesity, Class III, BMI 40-49.9 (morbid obesity) ([place]) 03/05/2013  . Routine health maintenance 03/05/2013  . Obstructive sleep apnea 09/21/2009  . HYPERCHOLESTEROLEMIA 08/26/2009  . TINEA PEDIS 09/03/2007  . Allergic rhinitis 09/03/2007  . Gastroesophageal reflux disease 09/03/2007    Current Outpatient Medications  Medication Sig Dispense Refill  . pseudoephedrine (SUDAFED 12 HOUR) 120 MG 12 hr tablet Take 1 tablet (120 mg total) by mouth every 12 (twelve) hours. 14 tablet 0  . atorvastatin (LIPITOR) 10 MG tablet Take 1 tablet (10 mg total) by mouth daily. (Patient not taking: Reported on 07/08/2019) 30 tablet 1  . cyclobenzaprine (FLEXERIL) 5 MG tablet Take 1 tablet (5 mg total) by mouth 3 (three) times daily as needed for muscle spasms. (Patient not taking: Reported on 07/08/2019) 30 tablet 0  . fluticasone (FLONASE) 50 MCG/ACT nasal spray Place 2 sprays into both nostrils daily. (Patient not taking: Reported on 07/08/2019) 16 g 6   No current facility-administered medications for this visit.     Allergies: Patient has no known allergies.  Past Medical History:  Diagnosis Date  . ALLERGIC RHINITIS 09/03/2007  . Allergy   . GERD 09/03/2007  . HYPERCHOLESTEROLEMIA 08/26/2009  . HYPERSOMNIA 08/26/2009  . OBSTRUCTIVE SLEEP APNEA 09/21/2009  . Prostatitis    multiple episodes  . Sleep apnea    CPAP use sometimes  . TINEA PEDIS 09/03/2007    Past Surgical History:  Procedure Laterality Date  . TOE AMPUTATION     3rd and 4th toes amputated after forklift injury    Family History  Problem Relation Age of Onset  . Hypertension  Mother   . Heart disease Mother        CAD  . Diabetes Father   . Gout Brother   . COPD Brother   . Cancer Neg Hx        No history Colon, Prostate Breast CA  . Colon cancer Neg Hx   . Esophageal cancer Neg Hx   . Stomach cancer Neg Hx     Social History   Tobacco Use  . Smoking status: Light Tobacco Smoker    Types: Cigars  . Smokeless tobacco: Never Used  . Tobacco comment: Very rarely. - 2x ayear   Substance Use Topics  . Alcohol use: Yes    Comment: maybe monthly per pt.    Subjective:  Presents to transfer care from provider who has recently left our office; did have [place] in mid-June but notes he is feeling better; 1) Requesting referral to ENT that his sister has seen to evaluate chronic sinus concerns;  2) History of diet controlled diabetes- last Hgba1c was at 6.6;  3) Hyperlipidemia- was started on Lipitor earlier this year but is not currently taking it; thinks it might have caused him "to just not feel well." 4) Sleep apnea- is trying to wear his CPAP more regularly; does feel that he is resting better.     Objective:  Vitals:   07/08/19 1050  BP: 138/80  Pulse: 75  Temp: 97.9 F (36.6 C)  TempSrc: Oral  SpO2: 94%  Weight: 284 lb (128.8 kg)  Height: 5' 9.5" (1.765  m)    General: Well developed, well nourished, in no acute distress  Skin : Warm and dry.  Head: Normocephalic and atraumatic  Eyes: Sclera and conjunctiva clear; pupils round and reactive to light; extraocular movements intact  Ears: External normal; canals clear; tympanic membranes normal  Oropharynx: Pink, supple. No suspicious lesions  Neck: Supple without thyromegaly, adenopathy  Lungs: Respirations unlabored; clear to auscultation bilaterally without wheeze, rales, rhonchi  CVS exam: normal rate and regular rhythm.  Neurologic: Alert and oriented; speech intact; face symmetrical; moves all extremities well; CNII-XII intact without focal deficit  Assessment:  1. Chronic congestion of  paranasal sinus   2. Diabetes mellitus without complication ([place])   3. Obstructive sleep apnea     Plan:  1. Refer to ENT as requested; 2. Will update labs- last Hgba1c was at 6.6; may need to consider trial of Metformin; not currently on statin or ACE or ARB; follow-up to be determined. 3. Continue with pulmonology as scheduled.   No follow-ups on file.  Orders Placed This Encounter  Procedures  . Comp Met (CMET)    Standing Status:   Future    Number of Occurrences:   1    Standing Expiration Date:   07/07/2020  . HgB A1c    Standing Status:   Future    Number of Occurrences:   1    Standing Expiration Date:   07/07/2020  . Lipid panel    Standing Status:   Future    Number of Occurrences:   1    Standing Expiration Date:   07/07/2020  . Ambulatory referral to ENT    Referral Priority:   Routine    Referral Type:   Consultation    Referral Reason:   Specialty Services Required    Referred to Provider:   Margaretha Sheffield, MD    Requested Specialty:   Otolaryngology    Number of Visits Requested:   1    Requested Prescriptions    No prescriptions requested or ordered in this encounter

## 2019-07-10 ENCOUNTER — Other Ambulatory Visit: Payer: Self-pay | Admitting: Family

## 2019-07-10 DIAGNOSIS — E7800 Pure hypercholesterolemia, unspecified: Secondary | ICD-10-CM

## 2019-07-10 MED ORDER — ATORVASTATIN CALCIUM 20 MG PO TABS: 20.0000 mg | ORAL_TABLET | Freq: Every day | ORAL | 1 refills | Status: DC

## 2019-07-13 ENCOUNTER — Telehealth: Payer: Self-pay | Admitting: Acute Care

## 2019-07-13 NOTE — Telephone Encounter (Signed)
ATC pt, line went to voicemail. LMTCB x1. 

## 2019-07-14 NOTE — Telephone Encounter (Signed)
Called and spoke with pt. Asked pt why we were told to call [facility] in regards to ordering a CPAP machine for him and he said that he as unsure why. Pt said that he has been using a loaner machine since April 2020 and he received a phone call from [place] for [place] to call them in regards to a machine for him. Stated to pt that we would follow up on this for him and he verbalized understanding.    Called [facility] and spoke with Marissa and told her the info stated by pt. Per Mable Fill, it seems like an order was needed for pt to receive new machine but I stated to her that an order was placed 03/12/2019 for pt to receive a new machine as he is still using a loaner. Marissa was able to see the order and said they would run it to see if they could be able to get pt a machine and would contact pt after this. Nothing further needed.

## 2019-12-07 ENCOUNTER — Encounter: Payer: Self-pay | Admitting: Family

## 2019-12-16 ENCOUNTER — Encounter: Payer: Self-pay | Admitting: Family

## 2019-12-16 ENCOUNTER — Other Ambulatory Visit: Payer: Self-pay

## 2019-12-16 ENCOUNTER — Ambulatory Visit (INDEPENDENT_AMBULATORY_CARE_PROVIDER_SITE_OTHER): Payer: 59 | Admitting: Family

## 2019-12-16 VITALS — BP 140/88 | HR 78 | Temp 97.8°F | Ht 69.5 in | Wt 286.6 lb

## 2019-12-16 DIAGNOSIS — M25521 Pain in right elbow: Secondary | ICD-10-CM | POA: Diagnosis not present

## 2019-12-16 DIAGNOSIS — E119 Type 2 diabetes mellitus without complications: Secondary | ICD-10-CM | POA: Diagnosis not present

## 2019-12-16 DIAGNOSIS — E782 Mixed hyperlipidemia: Secondary | ICD-10-CM | POA: Diagnosis not present

## 2019-12-16 DIAGNOSIS — Z125 Encounter for screening for malignant neoplasm of prostate: Secondary | ICD-10-CM | POA: Diagnosis not present

## 2019-12-16 DIAGNOSIS — J309 Allergic rhinitis, unspecified: Secondary | ICD-10-CM

## 2019-12-16 DIAGNOSIS — K219 Gastro-esophageal reflux disease without esophagitis: Secondary | ICD-10-CM

## 2019-12-16 LAB — CBC WITH DIFFERENTIAL/PLATELET
Basophils Absolute: 0 10*3/uL (ref 0.0–0.1)
Basophils Relative: 0.5 % (ref 0.0–3.0)
Eosinophils Absolute: 0.1 10*3/uL (ref 0.0–0.7)
Eosinophils Relative: 1.7 % (ref 0.0–5.0)
HCT: 41.3 % (ref 39.0–52.0)
Hemoglobin: 13.6 g/dL (ref 13.0–17.0)
Lymphocytes Relative: 32.1 % (ref 12.0–46.0)
Lymphs Abs: 2.5 10*3/uL (ref 0.7–4.0)
MCHC: 33 g/dL (ref 30.0–36.0)
MCV: 86.4 fl (ref 78.0–100.0)
Monocytes Absolute: 0.5 10*3/uL (ref 0.1–1.0)
Monocytes Relative: 6.2 % (ref 3.0–12.0)
Neutro Abs: 4.7 10*3/uL (ref 1.4–7.7)
Neutrophils Relative %: 59.5 % (ref 43.0–77.0)
Platelets: 244 10*3/uL (ref 150.0–400.0)
RBC: 4.78 Mil/uL (ref 4.22–5.81)
RDW: 13.4 % (ref 11.5–15.5)
WBC: 7.9 10*3/uL (ref 4.0–10.5)

## 2019-12-16 LAB — LIPID PANEL
Cholesterol: 183 mg/dL (ref 0–200)
HDL: 50.2 mg/dL (ref >39.00)
LDL Cholesterol: 115 mg/dL — ABNORMAL HIGH (ref 0–99)
NonHDL: 133.22
Total CHOL/HDL Ratio: 4
Triglycerides: 91 mg/dL (ref 0.0–149.0)
VLDL: 18.2 mg/dL (ref 0.0–40.0)

## 2019-12-16 LAB — COMPREHENSIVE METABOLIC PANEL
ALT: 18 U/L (ref 0–53)
AST: 18 U/L (ref 0–37)
Albumin: 4.6 g/dL (ref 3.5–5.2)
Alkaline Phosphatase: 70 U/L (ref 39–117)
BUN: 16 mg/dL (ref 6–23)
CO2: 32 mEq/L (ref 19–32)
Calcium: 9.7 mg/dL (ref 8.4–10.5)
Chloride: 102 mEq/L (ref 96–112)
Creatinine, Ser: 1.08 mg/dL (ref 0.40–1.50)
GFR: 86.94 mL/min (ref >60.00)
Glucose, Bld: 117 mg/dL — ABNORMAL HIGH (ref 70–99)
Potassium: 4.1 mEq/L (ref 3.5–5.1)
Sodium: 139 mEq/L (ref 135–145)
Total Bilirubin: 0.4 mg/dL (ref 0.2–1.2)
Total Protein: 7.3 g/dL (ref 6.0–8.3)

## 2019-12-16 LAB — PSA: PSA: 0.45 ng/mL (ref 0.10–4.00)

## 2019-12-16 LAB — HEMOGLOBIN A1C: Hgb A1c MFr Bld: 6.5 % (ref 4.6–6.5)

## 2019-12-16 MED ORDER — ROSUVASTATIN CALCIUM 20 MG PO TABS: 20.0000 mg | ORAL_TABLET | Freq: Every day | ORAL | 3 refills | Status: DC

## 2019-12-16 NOTE — Patient Instructions (Addendum)
Tennis Elbow Tennis elbow is swelling (inflammation) in your outer forearm, near your elbow. Swelling affects the tissues that connect muscle to bone (tendons). Tennis elbow can happen in any sport or job in which you use your elbow too much. It is caused by doing the same motion over and over. Tennis elbow can cause:  Pain and tenderness in your forearm and the outer part of your elbow. You may have pain all the time, or only when using the arm.  A burning feeling. This runs from your elbow through your arm.  Weak grip in your hand. Follow these instructions at home: Activity  Rest your elbow and wrist. Avoid activities that cause problems, as told by your doctor.  If told by your doctor, wear an elbow strap to reduce stress on the area.  Do physical therapy exercises as told.  If you lift an object, lift it with your palm facing up. This is easier on your elbow. Lifestyle  If your tennis elbow is caused by sports, check your equipment and make sure that: ? You are using it correctly. ? It fits you well.  If your tennis elbow is caused by work or by using a computer, take breaks often to stretch your arm. Talk with your manager about how you can manage your condition at work. If you have a brace:  Wear the brace as told by your doctor. Remove it only as told by your doctor.  Loosen the brace if your fingers tingle, get numb, or turn cold and blue.  Keep the brace clean.  If the brace is not waterproof, ask your doctor if you may take the brace off for bathing. If you must keep the brace on while bathing: ? Do not let it get wet. ? Cover it with a watertight covering when you take a bath or a shower. General instructions   If told, put ice on the painful area: ? Put ice in a plastic bag. ? Place a towel between your skin and the bag. ? Leave the ice on for 20 minutes, 2-3 times a day.  Take over-the-counter and prescription medicines only as told by your doctor.  Keep  all follow-up visits as told by your doctor. This is important. Contact a doctor if:  Your pain does not get better with treatment.  Your pain gets worse.  You have weakness in your forearm, hand, or fingers.  You cannot feel your forearm, hand, or fingers. Summary  Tennis elbow is swelling (inflammation) in your outer forearm, near your elbow.  Tennis elbow is caused by doing the same motion over and over.  Rest your elbow and wrist. Avoid activities that cause problems, as told by your doctor.  If told, put ice on the painful area for 20 minutes, 2-3 times a day. This information is not intended to replace advice given to you by your health care provider. Make sure you discuss any questions you have with your health care provider. Document Revised: 08/22/2018 Document Reviewed: 09/10/2017 Elsevier Patient Education  2020 [place] for Gastroesophageal Reflux Disease, Adult When you have gastroesophageal reflux disease (GERD), the foods you eat and your eating habits are very important. Choosing the right foods can help ease your discomfort. Think about working with a nutrition specialist (dietitian) to help you make good choices. What are tips for following this plan?  Meals  Choose healthy foods that are low in fat, such as fruits, vegetables, whole grains, low-fat dairy products, and  lean meat, fish, and poultry.  Eat small meals often instead of 3 large meals a day. Eat your meals slowly, and in a place where you are relaxed. Avoid bending over or lying down until 2-3 hours after eating.  Avoid eating meals 2-3 hours before bed.  Avoid drinking a lot of liquid with meals.  Cook foods using methods other than frying. Bake, grill, or broil food instead.  Avoid or limit: ? Chocolate. ? Peppermint or spearmint. ? Alcohol. ? Pepper. ? Black and decaffeinated coffee. ? Black and decaffeinated tea. ? Bubbly (carbonated) soft drinks. ? Caffeinated energy  drinks and soft drinks.  Limit high-fat foods such as: ? Fatty meat or fried foods. ? Whole milk, cream, butter, or ice cream. ? Nuts and nut butters. ? Pastries, donuts, and sweets made with butter or shortening.  Avoid foods that cause symptoms. These foods may be different for everyone. Common foods that cause symptoms include: ? Tomatoes. ? Oranges, lemons, and limes. ? Peppers. ? Spicy food. ? Onions and garlic. ? Vinegar. Lifestyle  Maintain a healthy weight. Ask your doctor what weight is healthy for you. If you need to lose weight, work with your doctor to do so safely.  Exercise for at least 30 minutes for 5 or more days each week, or as told by your doctor.  Wear loose-fitting clothes.  Do not smoke. If you need help quitting, ask your doctor.  Sleep with the head of your bed higher than your feet. Use a wedge under the mattress or blocks under the bed frame to raise the head of the bed. Summary  When you have gastroesophageal reflux disease (GERD), food and lifestyle choices are very important in easing your symptoms.  Eat small meals often instead of 3 large meals a day. Eat your meals slowly, and in a place where you are relaxed.  Limit high-fat foods such as fatty meat or fried foods.  Avoid bending over or lying down until 2-3 hours after eating.  Avoid peppermint and spearmint, caffeine, alcohol, and chocolate. This information is not intended to replace advice given to you by your health care provider. Make sure you discuss any questions you have with your health care provider. Document Revised: 03/19/2019 Document Reviewed: 01/01/2017 Elsevier Patient Education  [place].

## 2019-12-16 NOTE — Progress Notes (Signed)
Collin Murphy is a 54 y.o. male with the following history as recorded in EpicCare:  Patient Active Problem List   Diagnosis Date Noted  . Acute upper respiratory infection 06/01/2019  . Diabetes mellitus without complication ([place]) 09/01/3006  . Acute bilateral low back pain without sciatica 09/25/2017  . Bilateral lower extremity edema 06/28/2016  . Obesity, Class III, BMI 40-49.9 (morbid obesity) ([place]) 03/05/2013  . Routine health maintenance 03/05/2013  . Obstructive sleep apnea 09/21/2009  . HYPERCHOLESTEROLEMIA 08/26/2009  . TINEA PEDIS 09/03/2007  . Allergic rhinitis 09/03/2007  . Gastroesophageal reflux disease 09/03/2007    Current Outpatient Medications  Medication Sig Dispense Refill  . cyclobenzaprine (FLEXERIL) 10 MG tablet Take 10 mg by mouth 3 (three) times daily as needed for muscle spasms.    . diclofenac (VOLTAREN) 50 MG EC tablet Take 50 mg by mouth 2 (two) times daily.    Marland Kitchen omeprazole (PRILOSEC) 20 MG capsule Take 20 mg by mouth daily.    . pseudoephedrine (SUDAFED 12 HOUR) 120 MG 12 hr tablet Take 1 tablet (120 mg total) by mouth every 12 (twelve) hours. 14 tablet 0  . Triamcinolone Acetonide (NASACORT AQ NA) Place into the nose.    . rosuvastatin (CRESTOR) 20 MG tablet Take 1 tablet (20 mg total) by mouth daily. 90 tablet 3   No current facility-administered medications for this visit.    Allergies: Patient has no known allergies.  Past Medical History:  Diagnosis Date  . ALLERGIC RHINITIS 09/03/2007  . Allergy   . GERD 09/03/2007  . HYPERCHOLESTEROLEMIA 08/26/2009  . HYPERSOMNIA 08/26/2009  . OBSTRUCTIVE SLEEP APNEA 09/21/2009  . Prostatitis    multiple episodes  . Sleep apnea    CPAP use sometimes  . TINEA PEDIS 09/03/2007    Past Surgical History:  Procedure Laterality Date  . TOE AMPUTATION     3rd and 4th toes amputated after forklift injury    Family History  Problem Relation Age of Onset  . Hypertension Mother   . Heart disease Mother       CAD  . Diabetes Father   . Gout Brother   . COPD Brother   . Cancer Neg Hx        No history Colon, Prostate Breast CA  . Colon cancer Neg Hx   . Esophageal cancer Neg Hx   . Stomach cancer Neg Hx     Social History   Tobacco Use  . Smoking status: Light Tobacco Smoker    Types: Cigars  . Smokeless tobacco: Never Used  . Tobacco comment: Very rarely. - 2x ayear   Substance Use Topics  . Alcohol use: Yes    Comment: maybe monthly per pt.    Subjective:  Patient presents with concerns for worsening GERD symptoms; was started on Omeprazole per [facility] provider; notes he has been working on dietary changes to help with symptoms; patient admits his diet was definitely contributing to the symptoms;  Also complaining of right elbow pain x 1 week; notes that job involves repetitive motion; no numbness or tingling; no known injury or trauma to elbow; already has prescription for Diclofenac from recent [facility] visit but has not been taking;  Also needs to follow-up on elevated cholesterol; stopped Lipitor and is requesting trial of Crestor instead;    Objective:  Vitals:   12/16/19 0904  BP: 140/88  Pulse: 78  Temp: 97.8 F (36.6 C)  TempSrc: Oral  SpO2: 96%  Weight: 286 lb 9.6 oz (130 kg)  Height: 5' 9.5" (1.765 m)    General: Well developed, well nourished, in no acute distress  Skin : Warm and dry.  Head: Normocephalic and atraumatic  Lungs: Respirations unlabored; clear to auscultation bilaterally without wheeze, rales, rhonchi  CVS exam: normal rate and regular rhythm.  Musculoskeletal: No deformities; no active joint inflammation  Extremities: No edema, cyanosis, clubbing  Vessels: Symmetric bilaterally  Neurologic: Alert and oriented; speech intact; face symmetrical; moves all extremities well; CNII-XII intact without focal deficit   Assessment:  1. Diet-controlled type 2 diabetes mellitus ([place])   2. Mixed hyperlipidemia   3. Prostate cancer screening   4. Right elbow  pain   5. Gastroesophageal reflux disease, unspecified whether esophagitis present   6. Allergic rhinitis, unspecified seasonality, unspecified trigger     Plan:  1. Update labs today; encouraged to work on healthy diet choices; 2. Trial of Crestor 20 mg daily; 3. Check PSA today; 4. Patient has prescription for Diclofenac; use as directed; ice the elbow and follow-up worse, no better. May need to see sports medicine; 5. Encouraged dietary changes; take Omeprazole x 4 weeks as prescribed by [facility]; 6. Encouraged to schedule a follow-up with his ENT.   No follow-ups on file.  Orders Placed This Encounter  Procedures  . CBC w/Diff  . Comp Met (CMET)  . Lipid panel  . HgB A1c  . PSA    Requested Prescriptions   Signed Prescriptions Disp Refills  . rosuvastatin (CRESTOR) 20 MG tablet 90 tablet 3    Sig: Take 1 tablet (20 mg total) by mouth daily.

## 2020-03-03 ENCOUNTER — Ambulatory Visit: Payer: 59 | Attending: Internal Medicine

## 2020-03-03 DIAGNOSIS — Z23 Encounter for immunization: Secondary | ICD-10-CM

## 2020-03-03 NOTE — Progress Notes (Signed)
   Covid-19 Vaccination Clinic  Name:  Collin Murphy    MRN: [id number] DOB: [birth year]  03/03/2020  Collin Murphy was observed post Covid-19 immunization for 15 minutes without incident. He was provided with Vaccine Information Sheet and instruction to access the V-Safe system.   Collin Murphy was instructed to call 911 with any severe reactions post vaccine: Marland Kitchen Difficulty breathing  . Swelling of face and throat  . A fast heartbeat  . A bad rash all over body  . Dizziness and weakness   Immunizations Administered    Name Date Dose VIS Date Route   Pfizer COVID-19 Vaccine 03/03/2020  1:46 PM 0.3 mL 11/20/2019 Intramuscular   Manufacturer: [place]   Lot: [id number]   [place]: [id number]

## 2020-03-30 ENCOUNTER — Ambulatory Visit: Payer: 59 | Attending: Internal Medicine

## 2020-03-30 DIAGNOSIS — Z23 Encounter for immunization: Secondary | ICD-10-CM

## 2020-03-30 NOTE — Progress Notes (Signed)
   Covid-19 Vaccination Clinic  Name:  Collin Murphy    MRN: [id number] DOB: 26-Feb-1966  03/30/2020  [name] was observed post Covid-19 immunization for 15 minutes without incident. He was provided with Vaccine Information Sheet and instruction to access the V-Safe system.   [name] was instructed to call 911 with any severe reactions post vaccine: Marland Kitchen Difficulty breathing  . Swelling of face and throat  . A fast heartbeat  . A bad rash all over body  . Dizziness and weakness   Immunizations Administered    Name Date Dose VIS Date Route   Pfizer COVID-19 Vaccine 03/30/2020  9:12 AM 0.3 mL 02/03/2019 Intramuscular   Manufacturer: [place]   Lot: [id number]   [place]: [id number]

## 2020-04-30 ENCOUNTER — Other Ambulatory Visit: Payer: Self-pay

## 2020-04-30 ENCOUNTER — Emergency Department (HOSPITAL_COMMUNITY)
Admission: EM | Admit: 2020-04-30 | Discharge: 2020-04-30 | Disposition: A | Discharge status: Home / Self Care | Payer: 59 | Attending: Emergency Medicine | Admitting: Emergency Medicine

## 2020-04-30 ENCOUNTER — Emergency Department (HOSPITAL_COMMUNITY): Payer: 59

## 2020-04-30 ENCOUNTER — Encounter (HOSPITAL_COMMUNITY): Payer: Self-pay | Admitting: Emergency Medicine

## 2020-04-30 DIAGNOSIS — M62830 Muscle spasm of back: Secondary | ICD-10-CM | POA: Diagnosis not present

## 2020-04-30 DIAGNOSIS — M546 Pain in thoracic spine: Secondary | ICD-10-CM | POA: Diagnosis not present

## 2020-04-30 DIAGNOSIS — F17290 Nicotine dependence, other tobacco product, uncomplicated: Secondary | ICD-10-CM | POA: Insufficient documentation

## 2020-04-30 DIAGNOSIS — M545 Low back pain: Secondary | ICD-10-CM | POA: Diagnosis present

## 2020-04-30 MED ORDER — CYCLOBENZAPRINE HCL 10 MG PO TABS: 10.0000 mg | ORAL_TABLET | Freq: Two times a day (BID) | ORAL | 0 refills | Status: AC | PRN

## 2020-04-30 MED ORDER — ACETAMINOPHEN 500 MG PO TABS
1000.0000 mg | ORAL_TABLET | Freq: Once | ORAL | Status: AC
  Administered 2020-04-30: 1000 mg via ORAL
  Filled 2020-04-30: qty 2

## 2020-04-30 MED ORDER — DIAZEPAM 5 MG PO TABS
5.0000 mg | ORAL_TABLET | Freq: Once | ORAL | Status: AC
  Administered 2020-04-30: 5 mg via ORAL
  Filled 2020-04-30: qty 1

## 2020-04-30 MED ORDER — LIDOCAINE 5 % EX PTCH: 1.0000 | MEDICATED_PATCH | CUTANEOUS | 0 refills | Status: AC

## 2020-04-30 MED ORDER — PREDNISONE 20 MG PO TABS: 40.0000 mg | ORAL_TABLET | Freq: Every day | ORAL | 0 refills | Status: DC

## 2020-04-30 MED ORDER — OXYCODONE HCL 5 MG PO TABS
5.0000 mg | ORAL_TABLET | Freq: Once | ORAL | Status: AC
  Administered 2020-04-30: 5 mg via ORAL
  Filled 2020-04-30: qty 1

## 2020-04-30 MED ORDER — KETOROLAC TROMETHAMINE 15 MG/ML IJ SOLN
15.0000 mg | Freq: Once | INTRAMUSCULAR | Status: AC
  Administered 2020-04-30: 15 mg via INTRAMUSCULAR
  Filled 2020-04-30: qty 1

## 2020-04-30 NOTE — ED Notes (Signed)
Patient transported to XR. 

## 2020-04-30 NOTE — ED Provider Notes (Signed)
[facility] DEPT Provider Note   CSN: [id number] Arrival date & time: 04/30/20  1831     History Chief Complaint  Patient presents with  . Back Pain    Collin Murphy is a 54 y.o. male.  54 y.o male with a PMH of sciatica,DM controlled presents to the ED with a chief complaint of low back pain for the past few weeks.  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  Reports the pain is mostly sharp along his thoracic and lumbar spine exacerbated with standing up, lifting.  He has been taking Tylenol for his symptoms without much improvement in them.  He was previously seen at urgent care for the same complaint, was discharged with "a box of white pills "which solved his pain.  He does not have any urinary symptoms, bowel incontinence, fevers, prior history of IV drug use, saddle anesthesia.  Denies any leg weakness, tingling, numbness.No traumas.   The history is provided by the patient and medical records.       Past Medical History:  Diagnosis Date  . ALLERGIC RHINITIS 09/03/2007  . Allergy   . GERD 09/03/2007  . HYPERCHOLESTEROLEMIA 08/26/2009  . HYPERSOMNIA 08/26/2009  . OBSTRUCTIVE SLEEP APNEA 09/21/2009  . Prostatitis    multiple episodes  . Sleep apnea    CPAP use sometimes  . TINEA PEDIS 09/03/2007    Patient Active Problem List   Diagnosis Date Noted  . Acute upper respiratory infection 06/01/2019  . Diabetes mellitus without complication ([place]) 99991111  . Acute bilateral low back pain without sciatica 09/25/2017  . Bilateral lower extremity edema 06/28/2016  . Obesity, Class III, BMI 40-49.9 (morbid obesity) ([place]) 03/05/2013  . Routine health maintenance 03/05/2013  . Obstructive sleep apnea 09/21/2009  . HYPERCHOLESTEROLEMIA 08/26/2009  . TINEA PEDIS 09/03/2007  . Allergic rhinitis 09/03/2007  . Gastroesophageal reflux disease 09/03/2007    Past Surgical History:    Procedure Laterality Date  . TOE AMPUTATION     3rd and 4th toes amputated after forklift injury       Family History  Problem Relation Age of Onset  . Hypertension Mother   . Heart disease Mother        CAD  . Diabetes Father   . Gout Brother   . COPD Brother   . Cancer Neg Hx        No history Colon, Prostate Breast CA  . Colon cancer Neg Hx   . Esophageal cancer Neg Hx   . Stomach cancer Neg Hx     Social History   Tobacco Use  . Smoking status: Light Tobacco Smoker    Types: Cigars  . Smokeless tobacco: Never Used  . Tobacco comment: Very rarely. - 2x ayear   Substance Use Topics  . Alcohol use: Yes    Comment: maybe monthly per pt.  . Drug use: No    Home Medications Prior to Admission medications   Medication Sig Start Date End Date Taking? Authorizing Provider  cyclobenzaprine (FLEXERIL) 10 MG tablet Take 1 tablet (10 mg total) by mouth 2 (two) times daily as needed for up to 5 days for muscle spasms. 04/30/20 05/05/20  Janeece Fitting, PA-C  diclofenac (VOLTAREN) 50 MG EC tablet Take 50 mg by mouth 2 (two) times daily.    [provider]  lidocaine (LIDODERM) 5 % Place 1 patch onto the skin daily for 5 days.  Remove & Discard patch within 12 hours or as directed by MD 04/30/20 05/05/20  Janeece Fitting, PA-C  omeprazole (PRILOSEC) 20 MG capsule Take 20 mg by mouth daily.    [provider]  predniSONE (DELTASONE) 20 MG tablet Take 2 tablets (40 mg total) by mouth daily for 5 days. 04/30/20 05/05/20  Janeece Fitting, PA-C  pseudoephedrine (SUDAFED 12 HOUR) 120 MG 12 hr tablet Take 1 tablet (120 mg total) by mouth every 12 (twelve) hours. 05/31/19   Dorie Rank, MD  rosuvastatin (CRESTOR) 20 MG tablet Take 1 tablet (20 mg total) by mouth daily. 12/16/19   Marrian Salvage, FNP  Triamcinolone Acetonide (NASACORT AQ NA) Place into the nose.    [provider]    Allergies    Patient has no known allergies.  Review of Systems   Review of Systems   Constitutional: Negative for fever.  HENT: Negative for sore throat.   Respiratory: Negative for shortness of breath.   Cardiovascular: Negative for chest pain.  Gastrointestinal: Negative for abdominal pain, diarrhea and vomiting.  Genitourinary: Negative for flank pain.  Musculoskeletal: Positive for back pain and myalgias.  Neurological: Negative for light-headedness and headaches.  All other systems reviewed and are negative.   Physical Exam Updated Vital Signs BP 139/82 (BP Location: Left Arm)   Pulse 67   Temp 98.7 F (37.1 C) (Oral)   Resp 17   Ht 5\' 9"  (1.753 m)   Wt 131.5 kg   SpO2 97%   BMI 42.83 kg/m   Physical Exam Vitals and nursing note reviewed.  Constitutional:      Appearance: Normal appearance.  HENT:     Head: Normocephalic and atraumatic.     Nose: Nose normal.     Mouth/Throat:     Mouth: Mucous membranes are moist.  Eyes:     Pupils: Pupils are equal, round, and reactive to light.  Cardiovascular:     Rate and Rhythm: Normal rate.  Pulmonary:     Effort: Pulmonary effort is normal.  Abdominal:     General: Abdomen is flat.     Tenderness: There is no abdominal tenderness.  Musculoskeletal:     Cervical back: Normal, normal range of motion and neck supple.     Thoracic back: Spasms and bony tenderness present. No swelling, deformity or signs of trauma. Normal range of motion. No scoliosis.     Lumbar back: Tenderness present. No edema or spasms. Normal range of motion.       Back:     Comments: RLE- KF,KE 5/5 strength LLE- HF, HE 5/5 strength  Antalgic gait. No pronator drift. No leg drop.  CN I, II and VIII not tested. CN II-XII grossly intact bilaterally.     Skin:    General: Skin is warm and dry.  Neurological:     Mental Status: He is alert and oriented to person, place, and time.     ED Results / Procedures / Treatments   Labs (all labs ordered are listed, but only abnormal results are displayed) Labs Reviewed - No data to  display  EKG None  Radiology DG Thoracic Spine 2 View  Result Date: 04/30/2020 CLINICAL DATA:  Back pain, history of back spasm, heavy lifting EXAM: THORACIC SPINE 2 VIEWS COMPARISON:  None. FINDINGS: Preservation of the normal thoracic kyphosis. Upper thoracic levels better visualized on swimmer's view. No acute fracture or vertebral body height loss is seen. No other acute fracture or suspicious osseous lesion. No traumatic  listhesis. Mild multilevel intervertebral disc height loss with early spondylitic changes most pronounced at the T8-9 level with some anterior osteophytosis. IMPRESSION: No acute osseous abnormality. 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 Electronically Signed   By: Lovena Le M.D.   On: 04/30/2020 19:52   DG Lumbar Spine Complete  Result Date: 04/30/2020 CLINICAL DATA:  Back pain, history of spasm, heavy lifting at work EXAM: [place] 4+ VIEW COMPARISON:  None. FINDINGS: Five lumbar type vertebral bodies. No spondylolysis or spondylolisthesis. 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 Vertebral body heights are preserved. No acute fracture or suspicious osseous lesion. This is disc spaces are largely preserved with minimal discogenic change maximal L5-S1. Mild-to-moderate facet arthropathy present L4-5 and L5-S1 with potential foraminal narrowing likely exacerbated by the shortened pedicles. IMPRESSION: 1. No acute osseous abnormality in the lumbar spine. 2. Mild-to-moderate facet arthropathy at L4-5 and L5-S1 with potential foraminal narrowing likely exacerbated by what appear to be congenitally shortened pedicles. Electronically Signed   By: Lovena Le M.D.   On: 04/30/2020 19:51    Procedures Procedures (including critical care time)  Medications Ordered in ED Medications  diazepam (VALIUM) tablet 5 mg (5 mg Oral Given 04/30/20 1940)  ketorolac (TORADOL) 15 MG/ML  injection 15 mg (15 mg Intramuscular Given 04/30/20 1940)  acetaminophen (TYLENOL) tablet 1,000 mg (1,000 mg Oral Given 04/30/20 1940)  oxyCODONE (Oxy IR/ROXICODONE) immediate release tablet 5 mg (5 mg Oral Given 04/30/20 1940)    ED Course  I have reviewed the triage vital signs and the nursing notes.  Pertinent labs & imaging results that were available during my care of the patient were reviewed by me and considered in my medical decision making (see chart for details).    MDM Rules/Calculators/A&P   Patient with a past medical history of controlled diabetes, prior sciatica presents to the ED with a chief complaint of thoracic and lumbar spine tenderness for the past several weeks.  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  Seen at urgent care in the past, reports he was treated with symptomatic medication which helped his symptoms.  He denies any red flags such as fever, IV drug use, saddle anesthesia, tingling or numbness of his legs.  Pain is lysed to the thoracic and lumbar spine.  During evaluation patient's vitals are within normal limits, there is tenderness to palpation along the paraspinal thoracic region, midline region, lumbar spine and midline lumbar area.  He is ambulatory without antalgic gait, strength is slightly decreased due to pain.  Will provide with pain control, no prior films on file, no prior history of spinal stenosis or disc herniation.  Given pain medication for symptomatic treatment.  DG Lumbar spine showed:  1. No acute osseous abnormality in the lumbar spine.  2. Mild-to-moderate facet arthropathy at L4-5 and L5-S1 with  potential foraminal narrowing likely exacerbated by what appear to  be congenitally shortened pedicles.      These results were discussed with patient at length, he was reevaluated after receiving medication for improvement in his symptoms.  Patient understands  that he will likely need to follow-up with a back specialist in the near future.  He was also instructed on lifting techniques.  He will go home on a short course of muscle relaxers along with steroids.  We discussed side effects of these  medications.  Vitals are within normal limits.  Return precautions provided at length.   Portions of this note were generated with [employer]. Dictation errors may occur despite best attempts at proofreading.  Final Clinical Impression(s) / ED Diagnoses Final diagnoses:  Muscle spasm of back    Rx / DC Orders ED Discharge Orders         Ordered    cyclobenzaprine (FLEXERIL) 10 MG tablet  2 times daily PRN     04/30/20 2041    predniSONE (DELTASONE) 20 MG tablet  Daily     04/30/20 2041    lidocaine (LIDODERM) 5 %  Every 24 hours     04/30/20 2041           Janeece Fitting, PA-C 04/30/20 2047    Drenda Freeze, MD 05/01/20 [phone]

## 2020-04-30 NOTE — ED Triage Notes (Signed)
Patient complaining of mid back spasms. Patient states they started this morning. Patient states he has had back spasms before. Patient is not complaining of anything else.

## 2020-04-30 NOTE — Discharge Instructions (Addendum)
The x-rays of your back today were within normal limits.  I provided a prescription for a short course of muscle relaxers along with steroids.  Please be aware steroids, take 2 tablets daily for the next 5 days.  This medication can cause flushness, changes in appetite, insomnia.  Follow up with your primary care physician as needed.

## 2020-05-18 ENCOUNTER — Encounter: Payer: Self-pay | Admitting: Family

## 2020-05-18 ENCOUNTER — Ambulatory Visit: Payer: 59 | Admitting: Family

## 2020-05-18 ENCOUNTER — Other Ambulatory Visit: Payer: Self-pay

## 2020-05-18 VITALS — BP 138/76 | HR 73 | Temp 98.3°F | Wt 283.0 lb

## 2020-05-18 DIAGNOSIS — R3915 Urgency of urination: Secondary | ICD-10-CM

## 2020-05-18 DIAGNOSIS — N401 Enlarged prostate with lower urinary tract symptoms: Secondary | ICD-10-CM | POA: Diagnosis not present

## 2020-05-18 DIAGNOSIS — R600 Localized edema: Secondary | ICD-10-CM | POA: Diagnosis not present

## 2020-05-18 LAB — URINALYSIS
Bilirubin Urine: NEGATIVE
Hgb urine dipstick: NEGATIVE
Ketones, ur: NEGATIVE
Leukocytes,Ua: NEGATIVE
Nitrite: NEGATIVE
Specific Gravity, Urine: 1.025 (ref 1.000–1.030)
Total Protein, Urine: NEGATIVE
Urine Glucose: NEGATIVE
Urobilinogen, UA: 0.2 (ref 0.0–1.0)
pH: 7 (ref 5.0–8.0)

## 2020-05-18 LAB — COMPREHENSIVE METABOLIC PANEL
ALT: 19 U/L (ref 0–53)
AST: 17 U/L (ref 0–37)
Albumin: 4.4 g/dL (ref 3.5–5.2)
Alkaline Phosphatase: 69 U/L (ref 39–117)
BUN: 15 mg/dL (ref 6–23)
CO2: 32 mEq/L (ref 19–32)
Calcium: 9.3 mg/dL (ref 8.4–10.5)
Chloride: 102 mEq/L (ref 96–112)
Creatinine, Ser: 1.02 mg/dL (ref 0.40–1.50)
GFR: 92.72 mL/min (ref >60.00)
Glucose, Bld: 128 mg/dL — ABNORMAL HIGH (ref 70–99)
Potassium: 3.9 mEq/L (ref 3.5–5.1)
Sodium: 139 mEq/L (ref 135–145)
Total Bilirubin: 0.5 mg/dL (ref 0.2–1.2)
Total Protein: 7.1 g/dL (ref 6.0–8.3)

## 2020-05-18 LAB — BRAIN NATRIURETIC PEPTIDE: Pro B Natriuretic peptide (BNP): 15 pg/mL (ref 0.0–100.0)

## 2020-05-18 LAB — PSA: PSA: 0.48 ng/mL (ref 0.10–4.00)

## 2020-05-18 MED ORDER — TAMSULOSIN HCL 0.4 MG PO CAPS: 0.4000 mg | ORAL_CAPSULE | Freq: Every day | ORAL | 3 refills | Status: DC

## 2020-05-18 MED ORDER — FUROSEMIDE 20 MG PO TABS
20.0000 mg | ORAL_TABLET | Freq: Every day | ORAL | 0 refills | Status: DC | PRN
Start: 2020-05-18 — End: 2020-06-14

## 2020-05-18 NOTE — Patient Instructions (Addendum)
8:40 am on July 14     Benign Prostatic Hyperplasia  Benign prostatic hyperplasia (BPH) is an enlarged prostate gland that is caused by the normal aging process and not by cancer. The prostate is a walnut-sized gland that is involved in the production of semen. It is located in front of the rectum and below the bladder. The bladder stores urine and the urethra is the tube that carries the urine out of the body. The prostate may get bigger as a man gets older. An enlarged prostate can press on the urethra. This can make it harder to pass urine. The build-up of urine in the bladder can cause infection. Back pressure and infection may progress to bladder damage and kidney (renal) failure. What are the causes? This condition is part of a normal aging process. However, not all men develop problems from this condition. If the prostate enlarges away from the urethra, urine flow will not be blocked. If it enlarges toward the urethra and compresses it, there will be problems passing urine. What increases the risk? This condition is more likely to develop in men over the age of 50 years. What are the signs or symptoms? Symptoms of this condition include:  Getting up often during the night to urinate.  Needing to urinate frequently during the day.  Difficulty starting urine flow.  Decrease in size and strength of your urine stream.  Leaking (dribbling) after urinating.  Inability to pass urine. This needs immediate treatment.  Inability to completely empty your bladder.  Pain when you pass urine. This is more common if there is also an infection.  Urinary tract infection (UTI). How is this diagnosed? This condition is diagnosed based on your medical history, a physical exam, and your symptoms. Tests will also be done, such as:  A post-void bladder scan. This measures any amount of urine that may remain in your bladder after you finish urinating.  A digital rectal exam. In a rectal exam,  your health care provider checks your prostate by putting a lubricated, gloved finger into your rectum to feel the back of your prostate gland. This exam detects the size of your gland and any abnormal lumps or growths.  An exam of your urine (urinalysis).  A prostate specific antigen (PSA) screening. This is a blood test used to screen for prostate cancer.  An ultrasound. This test uses sound waves to electronically produce a picture of your prostate gland. Your health care provider may refer you to a specialist in kidney and prostate diseases (urologist). How is this treated? Once symptoms begin, your health care provider will monitor your condition (active surveillance or watchful waiting). Treatment for this condition will depend on the severity of your condition. Treatment may include:  Observation and yearly exams. This may be the only treatment needed if your condition and symptoms are mild.  Medicines to relieve your symptoms, including: ? Medicines to shrink the prostate. ? Medicines to relax the muscle of the prostate.  Surgery in severe cases. Surgery may include: ? Prostatectomy. In this procedure, the prostate tissue is removed completely through an open incision or with a laparoscope or robotics. ? Transurethral resection of the prostate (TURP). In this procedure, a tool is inserted through the opening at the tip of the penis (urethra). It is used to cut away tissue of the inner core of the prostate. The pieces are removed through the same opening of the penis. This removes the blockage. ? Transurethral incision (TUIP). In this procedure,  small cuts are made in the prostate. This lessens the prostate's pressure on the urethra. ? Transurethral microwave thermotherapy (TUMT). This procedure uses microwaves to create heat. The heat destroys and removes a small amount of prostate tissue. ? Transurethral needle ablation (TUNA). This procedure uses radio frequencies to destroy and  remove a small amount of prostate tissue. ? Interstitial laser coagulation ([place]). This procedure uses a laser to destroy and remove a small amount of prostate tissue. ? Transurethral electrovaporization (TUVP). This procedure uses electrodes to destroy and remove a small amount of prostate tissue. ? Prostatic urethral lift. This procedure inserts an implant to push the lobes of the prostate away from the urethra. Follow these instructions at home:  Take over-the-counter and prescription medicines only as told by your health care provider.  Monitor your symptoms for any changes. Contact your health care provider with any changes.  Avoid drinking large amounts of liquid before going to bed or out in public.  Avoid or reduce how much caffeine or alcohol you drink.  Give yourself time when you urinate.  Keep all follow-up visits as told by your health care provider. This is important. Contact a health care provider if:  You have unexplained back pain.  Your symptoms do not get better with treatment.  You develop side effects from the medicine you are taking.  Your urine becomes very dark or has a bad smell.  Your lower abdomen becomes distended and you have trouble passing your urine. Get help right away if:  You have a fever or chills.  You suddenly cannot urinate.  You feel lightheaded, or very dizzy, or you faint.  There are large amounts of blood or clots in the urine.  Your urinary problems become hard to manage.  You develop moderate to severe low back or flank pain. The flank is the side of your body between the ribs and the hip. These symptoms may represent a serious problem that is an emergency. Do not wait to see if the symptoms will go away. Get medical help right away. Call your local emergency services (911 in the [place] Do not drive yourself to the hospital. Summary  Benign prostatic hyperplasia (BPH) is an enlarged prostate that is caused by the normal aging  process and not by cancer.  An enlarged prostate can press on the urethra. This can make it hard to pass urine.  This condition is part of a normal aging process and is more likely to develop in men over the age of 67 years.  Get help right away if you suddenly cannot urinate. This information is not intended to replace advice given to you by your health care provider. Make sure you discuss any questions you have with your health care provider. Document Revised: 10/21/2018 Document Reviewed: 12/31/2016 Elsevier Patient Education  2020 [employer].

## 2020-05-18 NOTE — Progress Notes (Signed)
Collin Murphy is a 54 y.o. male with the following history as recorded in EpicCare:  Patient Active Problem List   Diagnosis Date Noted  . Acute upper respiratory infection 06/01/2019  . Diabetes mellitus without complication ([place]) 73/71/0626  . Acute bilateral low back pain without sciatica 09/25/2017  . Bilateral lower extremity edema 06/28/2016  . Obesity, Class III, BMI 40-49.9 (morbid obesity) ([place]) 03/05/2013  . Routine health maintenance 03/05/2013  . Obstructive sleep apnea 09/21/2009  . HYPERCHOLESTEROLEMIA 08/26/2009  . TINEA PEDIS 09/03/2007  . Allergic rhinitis 09/03/2007  . Gastroesophageal reflux disease 09/03/2007    Current Outpatient Medications  Medication Sig Dispense Refill  . cetirizine (ZYRTEC ALLERGY) 10 MG tablet Take 10 mg by mouth daily as needed for allergies.    Marland Kitchen diclofenac (VOLTAREN) 50 MG EC tablet Take 50 mg by mouth 2 (two) times daily as needed for mild pain.     . fexofenadine (ALLEGRA) 180 MG tablet Take 180 mg by mouth daily as needed for allergies or rhinitis.    . furosemide (LASIX) 20 MG tablet Take 1 tablet (20 mg total) by mouth daily as needed for fluid or edema. 30 tablet 0  . rosuvastatin (CRESTOR) 20 MG tablet Take 1 tablet (20 mg total) by mouth daily. 90 tablet 3  . tamsulosin (FLOMAX) 0.4 MG CAPS capsule Take 1 capsule (0.4 mg total) by mouth at bedtime. 30 capsule 3  . Triamcinolone Acetonide (NASACORT AQ NA) Place 1 spray into the nose daily as needed (allergies).      No current facility-administered medications for this visit.    Allergies: Patient has no known allergies.  Past Medical History:  Diagnosis Date  . ALLERGIC RHINITIS 09/03/2007  . Allergy   . GERD 09/03/2007  . HYPERCHOLESTEROLEMIA 08/26/2009  . HYPERSOMNIA 08/26/2009  . OBSTRUCTIVE SLEEP APNEA 09/21/2009  . Prostatitis    multiple episodes  . Sleep apnea    CPAP use sometimes  . TINEA PEDIS 09/03/2007    Past Surgical History:  Procedure Laterality Date   . TOE AMPUTATION     3rd and 4th toes amputated after forklift injury    Family History  Problem Relation Age of Onset  . Hypertension Mother   . Heart disease Mother        CAD  . Diabetes Father   . Gout Brother   . COPD Brother   . Cancer Neg Hx        No history Colon, Prostate Breast CA  . Colon cancer Neg Hx   . Esophageal cancer Neg Hx   . Stomach cancer Neg Hx     Social History   Tobacco Use  . Smoking status: Light Tobacco Smoker    Types: Cigars  . Smokeless tobacco: Never Used  . Tobacco comment: Very rarely. - 2x ayear   Substance Use Topics  . Alcohol use: Yes    Comment: maybe monthly per pt.    Subjective:  Patient was concerned last week that his urine stream "seemed slow." Denies any frequency or urgency or blood in urine; no fever; does feel that his symptoms have improved this week; requesting to get his prostate and kidneys checked today; did take course of prednisone for acute back injury; does feel that start of symptoms correlated with start of prednisone and have begun to improve as he has tapered off the dosage.   Also notes he has problems with chronic swelling of his lower extremities "for years." Notes that his hand will  sometimes feel "tight" too- no chest pain or shortness of breath or wheezing; thinks he has tried a fluid pill in the past with limited benefit.      Objective:  Vitals:   05/18/20 0847  BP: 138/76  Pulse: 73  Temp: 98.3 F (36.8 C)  TempSrc: Oral  SpO2: 95%  Weight: 283 lb (128.4 kg)    General: Well developed, well nourished, in no acute distress  Skin : Warm and dry.  Head: Normocephalic and atraumatic  Lungs: Respirations unlabored; clear to auscultation bilaterally without wheeze, rales, rhonchi  CVS exam: normal rate and regular rhythm.  Musculoskeletal: No deformities; no active joint inflammation  Extremities:mild pitting edema noted bilaterally, cyanosis, clubbing  Vessels: Symmetric bilaterally   Neurologic: Alert and oriented; speech intact; face symmetrical; moves all extremities well; CNII-XII intact without focal deficit   Assessment:  1. Benign prostatic hyperplasia (BPH) with urinary urgency   2. Pedal edema     Plan:  1. ? Reaction to prednisone; symptoms are improving now; check labs today including CMP, U/A and PSA; trial of Flomax for the next 1-2 weeks; 2. Chronic issue- check BNP, CXR; trial of Lasix 20 mg prn for swelling;  Follow up in 1 month, sooner prn.  This visit occurred during the SARS-CoV-2 public health emergency.  Safety protocols were in place, including screening questions prior to the visit, additional usage of staff PPE, and extensive cleaning of exam room while observing appropriate contact time as indicated for disinfecting solutions.      No follow-ups on file.  Orders Placed This Encounter  Procedures  . DG Chest 2 View    Order Specific Question:   Reason for Exam (SYMPTOM  OR DIAGNOSIS REQUIRED)    Answer:   pedal edema    Order Specific Question:   Preferred imaging location?    Answer:   [facility]    Order Specific Question:   Radiology Contrast Protocol - do NOT remove file path    Answer:   [email or web address]  . Comp Met (CMET)  . PSA  . Urinalysis  . B Nat Peptide    Requested Prescriptions   Signed Prescriptions Disp Refills  . tamsulosin (FLOMAX) 0.4 MG CAPS capsule 30 capsule 3    Sig: Take 1 capsule (0.4 mg total) by mouth at bedtime.  . furosemide (LASIX) 20 MG tablet 30 tablet 0    Sig: Take 1 tablet (20 mg total) by mouth daily as needed for fluid or edema.

## 2020-06-12 ENCOUNTER — Other Ambulatory Visit: Payer: Self-pay | Admitting: Family

## 2020-06-22 ENCOUNTER — Ambulatory Visit: Payer: 59 | Admitting: Family

## 2020-06-22 ENCOUNTER — Other Ambulatory Visit: Payer: Self-pay

## 2020-06-22 ENCOUNTER — Encounter: Payer: Self-pay | Admitting: Family

## 2020-06-22 VITALS — BP 130/72 | HR 69 | Temp 98.9°F | Wt 284.6 lb

## 2020-06-22 DIAGNOSIS — E119 Type 2 diabetes mellitus without complications: Secondary | ICD-10-CM

## 2020-06-22 DIAGNOSIS — E782 Mixed hyperlipidemia: Secondary | ICD-10-CM | POA: Diagnosis not present

## 2020-06-22 DIAGNOSIS — R600 Localized edema: Secondary | ICD-10-CM

## 2020-06-22 DIAGNOSIS — R3912 Poor urinary stream: Secondary | ICD-10-CM

## 2020-06-22 DIAGNOSIS — N401 Enlarged prostate with lower urinary tract symptoms: Secondary | ICD-10-CM

## 2020-06-22 MED ORDER — FUROSEMIDE 20 MG PO TABS: 20.0000 mg | ORAL_TABLET | Freq: Every day | ORAL | 0 refills | Status: DC | PRN

## 2020-06-22 NOTE — Progress Notes (Signed)
Collin Murphy is a 54 y.o. male with the following history as recorded in EpicCare:  Patient Active Problem List   Diagnosis Date Noted  . Acute upper respiratory infection 06/01/2019  . Diabetes mellitus without complication ([place]) 51/76/1607  . Acute bilateral low back pain without sciatica 09/25/2017  . Bilateral lower extremity edema 06/28/2016  . Obesity, Class III, BMI 40-49.9 (morbid obesity) ([place]) 03/05/2013  . Routine health maintenance 03/05/2013  . Obstructive sleep apnea 09/21/2009  . HYPERCHOLESTEROLEMIA 08/26/2009  . TINEA PEDIS 09/03/2007  . Allergic rhinitis 09/03/2007  . Gastroesophageal reflux disease 09/03/2007    Current Outpatient Medications  Medication Sig Dispense Refill  . cetirizine (ZYRTEC ALLERGY) 10 MG tablet Take 10 mg by mouth daily as needed for allergies.    Marland Kitchen diclofenac (VOLTAREN) 50 MG EC tablet Take 50 mg by mouth 2 (two) times daily as needed for mild pain.     . fexofenadine (ALLEGRA) 180 MG tablet Take 180 mg by mouth daily as needed for allergies or rhinitis.    . furosemide (LASIX) 20 MG tablet Take 1 tablet (20 mg total) by mouth daily as needed for fluid or edema. 30 tablet 0  . rosuvastatin (CRESTOR) 20 MG tablet Take 1 tablet (20 mg total) by mouth daily. 90 tablet 3  . tamsulosin (FLOMAX) 0.4 MG CAPS capsule Take 1 capsule (0.4 mg total) by mouth at bedtime. 30 capsule 3  . Triamcinolone Acetonide (NASACORT AQ NA) Place 1 spray into the nose daily as needed (allergies).      No current facility-administered medications for this visit.    Allergies: Patient has no known allergies.  Past Medical History:  Diagnosis Date  . ALLERGIC RHINITIS 09/03/2007  . Allergy   . GERD 09/03/2007  . HYPERCHOLESTEROLEMIA 08/26/2009  . HYPERSOMNIA 08/26/2009  . OBSTRUCTIVE SLEEP APNEA 09/21/2009  . Prostatitis    multiple episodes  . Sleep apnea    CPAP use sometimes  . TINEA PEDIS 09/03/2007    Past Surgical History:  Procedure Laterality Date   . TOE AMPUTATION     3rd and 4th toes amputated after forklift injury    Family History  Problem Relation Age of Onset  . Hypertension Mother   . Heart disease Mother        CAD  . Diabetes Father   . Gout Brother   . COPD Brother   . Cancer Neg Hx        No history Colon, Prostate Breast CA  . Colon cancer Neg Hx   . Esophageal cancer Neg Hx   . Stomach cancer Neg Hx     Social History   Tobacco Use  . Smoking status: Light Tobacco Smoker    Types: Cigars  . Smokeless tobacco: Never Used  . Tobacco comment: Very rarely. - 2x ayear   Substance Use Topics  . Alcohol use: Yes    Comment: maybe monthly per pt.    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; wants to continue for now;  Did not understand to start the Lasix; has taken in the past and would be willing to try again; has dealt with chronic swelling in the past; Right leg is more noticeable than left;   Planning to see his work employer about persistent low back pain; feels it is work related and wants them  to manage; went to ER in May and was given muscle relaxers with some benefit;      Objective:  Vitals:   06/22/20 0843  BP: 130/72  Pulse: 69  Temp: 98.9 F (37.2 C)  TempSrc: Oral  SpO2: 94%  Weight: 284 lb 9.6 oz (129.1 kg)    General: Well developed, well nourished, in no acute distress  Skin : Warm and dry.  Head: Normocephalic and atraumatic  Lungs: Respirations unlabored; clear to auscultation bilaterally without wheeze, rales, rhonchi  CVS exam: normal rate and regular rhythm.  Extremities: No pitting edema, cyanosis, clubbing  Vessels: Symmetric bilaterally  Neurologic: Alert and oriented; speech intact; face symmetrical; moves all extremities well; CNII-XII intact without focal deficit   Assessment:  1. Benign prostatic hyperplasia  with weak urinary stream   2. Pedal edema   3. Diet-controlled type 2 diabetes mellitus ([place])   4. Mixed hyperlipidemia     Plan:  1. Reviewed labs done at last OV; patient will continue Flomax for now; may need to consider imaging and/ or urology evaluation; 2. Trial of Lasix 20 mg qd prn; if no relief, may need to consider vascular evaluation; 3. Plan to re-check labs in 6 months; 4. Stay on Crestor;  This visit occurred during the SARS-CoV-2 public health emergency.  Safety protocols were in place, including screening questions prior to the visit, additional usage of staff PPE, and extensive cleaning of exam room while observing appropriate contact time as indicated for disinfecting solutions.     Return in about 6 months (around 12/23/2020) for CPE.  No orders of the defined types were placed in this encounter.   Requested Prescriptions   Signed Prescriptions Disp Refills  . furosemide (LASIX) 20 MG tablet 30 tablet 0    Sig: Take 1 tablet (20 mg total) by mouth daily as needed for fluid or edema.

## 2021-02-13 ENCOUNTER — Encounter: Payer: Self-pay | Admitting: Family

## 2021-02-13 ENCOUNTER — Ambulatory Visit (INDEPENDENT_AMBULATORY_CARE_PROVIDER_SITE_OTHER): Payer: 59

## 2021-02-13 ENCOUNTER — Ambulatory Visit: Payer: 59 | Admitting: Family

## 2021-02-13 ENCOUNTER — Other Ambulatory Visit: Payer: Self-pay

## 2021-02-13 VITALS — BP 150/80 | HR 83 | Temp 99.4°F | Ht 69.0 in | Wt 296.0 lb

## 2021-02-13 DIAGNOSIS — R350 Frequency of micturition: Secondary | ICD-10-CM | POA: Diagnosis not present

## 2021-02-13 DIAGNOSIS — M545 Low back pain, unspecified: Secondary | ICD-10-CM

## 2021-02-13 DIAGNOSIS — N401 Enlarged prostate with lower urinary tract symptoms: Secondary | ICD-10-CM | POA: Diagnosis not present

## 2021-02-13 DIAGNOSIS — R030 Elevated blood-pressure reading, without diagnosis of hypertension: Secondary | ICD-10-CM

## 2021-02-13 DIAGNOSIS — R1032 Left lower quadrant pain: Secondary | ICD-10-CM | POA: Diagnosis not present

## 2021-02-13 NOTE — Progress Notes (Signed)
Collin Murphy is a 55 y.o. male with the following history as recorded in EpicCare:  Patient Active Problem List   Diagnosis Date Noted  . Acute upper respiratory infection 06/01/2019  . Diabetes mellitus without complication ([place]) 83/15/1761  . Pressure sensation in right ear 06/10/2018  . Acute bilateral low back pain without sciatica 09/25/2017  . Bilateral lower extremity edema 06/28/2016  . Obesity, Class III, BMI 40-49.9 (morbid obesity) ([place]) 03/05/2013  . Routine health maintenance 03/05/2013  . Obstructive sleep apnea 09/21/2009  . HYPERCHOLESTEROLEMIA 08/26/2009  . TINEA PEDIS 09/03/2007  . Allergic rhinitis 09/03/2007  . Gastroesophageal reflux disease 09/03/2007    Current Outpatient Medications  Medication Sig Dispense Refill  . cetirizine (ZYRTEC) 10 MG tablet Take 10 mg by mouth daily as needed for allergies.    Marland Kitchen diclofenac (VOLTAREN) 50 MG EC tablet Take 50 mg by mouth 2 (two) times daily as needed for mild pain.     . fexofenadine (ALLEGRA) 180 MG tablet Take 180 mg by mouth daily as needed for allergies or rhinitis.    . furosemide (LASIX) 20 MG tablet Take 1 tablet (20 mg total) by mouth daily as needed for fluid or edema. 30 tablet 0  . rosuvastatin (CRESTOR) 20 MG tablet Take 1 tablet (20 mg total) by mouth daily. 90 tablet 3  . tamsulosin (FLOMAX) 0.4 MG CAPS capsule Take 1 capsule (0.4 mg total) by mouth at bedtime. 30 capsule 3  . Triamcinolone Acetonide (NASACORT AQ NA) Place 1 spray into the nose daily as needed (allergies).      No current facility-administered medications for this visit.    Allergies: Patient has no known allergies.  Past Medical History:  Diagnosis Date  . ALLERGIC RHINITIS 09/03/2007  . Allergy   . GERD 09/03/2007  . HYPERCHOLESTEROLEMIA 08/26/2009  . HYPERSOMNIA 08/26/2009  . OBSTRUCTIVE SLEEP APNEA 09/21/2009  . Prostatitis    multiple episodes  . Sleep apnea    CPAP use sometimes  . TINEA PEDIS 09/03/2007    Past Surgical  History:  Procedure Laterality Date  . TOE AMPUTATION     3rd and 4th toes amputated after forklift injury    Family History  Problem Relation Age of Onset  . Hypertension Mother   . Heart disease Mother        CAD  . Diabetes Father   . Gout Brother   . COPD Brother   . Cancer Neg Hx        No history Colon, Prostate Breast CA  . Colon cancer Neg Hx   . Esophageal cancer Neg Hx   . Stomach cancer Neg Hx     Social History   Tobacco Use  . Smoking status: Light Tobacco Smoker    Types: Cigars  . Smokeless tobacco: Never Used  . Tobacco comment: Very rarely. - 2x ayear   Substance Use Topics  . Alcohol use: Yes    Comment: maybe monthly per pt.    Subjective:   Numerous concerns today:  1) Left sided groin pain x 1 month; feels like pain is radiating from his back into his groin; no known injury, no changes in bowel movements; 2) Continuing to feel like his urinary flow "is not right." Limited improvement with trial of Flomax; 3) Has gained 12 pounds since last OV in 06/2020;   Objective:  Vitals:   02/13/21 1603  BP: (!) 150/80  Pulse: 83  Temp: 99.4 F (37.4 C)  TempSrc: Oral  SpO2:  95%  Weight: 296 lb (134.3 kg)  Height: _0  (1.753 m)    General: Well developed, well nourished, in no acute distress  Skin : Warm and dry.  Head: Normocephalic and atraumatic  Eyes: Sclera and conjunctiva clear; pupils round and reactive to light; extraocular movements intact   Ears: External normal; canals clear; tympanic membranes normal  Oropharynx: Pink, supple. No suspicious lesions  Neck: Supple without thyromegaly, adenopathy  Lungs: Respirations unlabored; clear to auscultation bilaterally without wheeze, rales, rhonchi  CVS exam: normal rate, regular rhythm, normal S1, S2, no murmurs, rubs, clicks or gallops.  Abdomen: Soft; nontender; nondistended; normoactive bowel sounds; no masses or hepatosplenomegaly  Musculoskeletal: No deformities; no active joint  inflammation  Extremities: No edema, cyanosis, clubbing  Vessels: Symmetric bilaterally  Neurologic: Alert and oriented; speech intact; face symmetrical; moves all extremities well; CNII-XII intact without focal deficit   Assessment:  1. LLQ pain   2. Benign prostatic hyperplasia with lower urinary tract symptoms, symptom details unspecified   3. Urinary frequency   4. Low back pain, unspecified back pain laterality, unspecified chronicity, unspecified whether sciatica present   5. Elevated blood pressure reading     Plan:  1. Will update lumbar X-ray and abd/ pelvic CT; suspect symptoms are related to low back issues and pain is radiating into groin; will need to consider lumbar MRI assuming CT is normal; check CBC, CMP today;  2. Check PSA again; refer to urology- limited benefit with Flomax; 3. Check Hgba1c today;   5. Encouraged weight loss and continue to check blood pressure- will not start medication at this time but needs to start checking regularly;   No follow-ups on file.  Orders Placed This Encounter  Procedures  . CT Abdomen Pelvis W Contrast    Standing Status:   Future    Standing Expiration Date:   02/13/2022    Order Specific Question:   If indicated for the ordered procedure, I authorize the administration of contrast media per Radiology protocol    Answer:   Yes    Order Specific Question:   Preferred imaging location?    Answer:   [facility]    Order Specific Question:   Is Oral Contrast requested for this exam?    Answer:   Yes, Per Radiology protocol  . DG Lumbar Spine Complete    Standing Status:   Future    Number of Occurrences:   1    Standing Expiration Date:   02/13/2022    Order Specific Question:   Reason for Exam (SYMPTOM  OR DIAGNOSIS REQUIRED)    Answer:   low back pain    Order Specific Question:   Preferred imaging location?    Answer:   [facility]  . PSA    Standing Status:   Future    Number of Occurrences:   1    Standing  Expiration Date:   02/13/2022  . Hemoglobin A1c    Standing Status:   Future    Number of Occurrences:   1    Standing Expiration Date:   02/13/2022  . CBC with Differential/Platelet    Standing Status:   Future    Number of Occurrences:   1    Standing Expiration Date:   02/13/2022  . Comp Met (CMET)    Standing Status:   Future    Number of Occurrences:   1    Standing Expiration Date:   02/13/2022  . Ambulatory referral to  Urology    Referral Priority:   Routine    Referral Type:   Consultation    Referral Reason:   Specialty Services Required    Requested Specialty:   Urology    Number of Visits Requested:   1    Requested Prescriptions    No prescriptions requested or ordered in this encounter

## 2021-02-14 ENCOUNTER — Encounter: Payer: Self-pay | Admitting: Family

## 2021-02-14 LAB — COMPREHENSIVE METABOLIC PANEL
ALT: 18 U/L (ref 0–53)
AST: 17 U/L (ref 0–37)
Albumin: 4.4 g/dL (ref 3.5–5.2)
Alkaline Phosphatase: 72 U/L (ref 39–117)
BUN: 14 mg/dL (ref 6–23)
CO2: 33 mEq/L — ABNORMAL HIGH (ref 19–32)
Calcium: 9.4 mg/dL (ref 8.4–10.5)
Chloride: 102 mEq/L (ref 96–112)
Creatinine, Ser: 1.14 mg/dL (ref 0.40–1.50)
GFR: 73.71 mL/min (ref >60.00)
Glucose, Bld: 120 mg/dL — ABNORMAL HIGH (ref 70–99)
Potassium: 4.3 mEq/L (ref 3.5–5.1)
Sodium: 140 mEq/L (ref 135–145)
Total Bilirubin: 0.5 mg/dL (ref 0.2–1.2)
Total Protein: 7.1 g/dL (ref 6.0–8.3)

## 2021-02-14 LAB — PSA: PSA: 0.51 ng/mL (ref 0.10–4.00)

## 2021-02-14 LAB — CBC WITH DIFFERENTIAL/PLATELET
Basophils Absolute: 0.1 10*3/uL (ref 0.0–0.1)
Basophils Relative: 0.6 % (ref 0.0–3.0)
Eosinophils Absolute: 0.1 10*3/uL (ref 0.0–0.7)
Eosinophils Relative: 1.2 % (ref 0.0–5.0)
HCT: 39 % (ref 39.0–52.0)
Hemoglobin: 13.3 g/dL (ref 13.0–17.0)
Lymphocytes Relative: 33 % (ref 12.0–46.0)
Lymphs Abs: 3.3 10*3/uL (ref 0.7–4.0)
MCHC: 34 g/dL (ref 30.0–36.0)
MCV: 86.3 fl (ref 78.0–100.0)
Monocytes Absolute: 0.8 10*3/uL (ref 0.1–1.0)
Monocytes Relative: 7.8 % (ref 3.0–12.0)
Neutro Abs: 5.8 10*3/uL (ref 1.4–7.7)
Neutrophils Relative %: 57.4 % (ref 43.0–77.0)
Platelets: 241 10*3/uL (ref 150.0–400.0)
RBC: 4.52 Mil/uL (ref 4.22–5.81)
RDW: 13.7 % (ref 11.5–15.5)
WBC: 10.1 10*3/uL (ref 4.0–10.5)

## 2021-02-14 LAB — HEMOGLOBIN A1C: Hgb A1c MFr Bld: 6.4 % (ref 4.6–6.5)

## 2021-02-15 ENCOUNTER — Other Ambulatory Visit (INDEPENDENT_AMBULATORY_CARE_PROVIDER_SITE_OTHER): Payer: 59

## 2021-02-15 ENCOUNTER — Ambulatory Visit: Payer: 59 | Admitting: Family

## 2021-02-15 ENCOUNTER — Other Ambulatory Visit: Payer: Self-pay | Admitting: Family

## 2021-02-15 DIAGNOSIS — R3912 Poor urinary stream: Secondary | ICD-10-CM | POA: Diagnosis not present

## 2021-02-15 DIAGNOSIS — E119 Type 2 diabetes mellitus without complications: Secondary | ICD-10-CM

## 2021-02-15 LAB — URINALYSIS, ROUTINE W REFLEX MICROSCOPIC
Bilirubin Urine: NEGATIVE
Hgb urine dipstick: NEGATIVE
Ketones, ur: NEGATIVE
Nitrite: NEGATIVE
Specific Gravity, Urine: 1.02 (ref 1.000–1.030)
Total Protein, Urine: NEGATIVE
Urine Glucose: NEGATIVE
Urobilinogen, UA: 0.2 (ref 0.0–1.0)
pH: 7 (ref 5.0–8.0)

## 2021-02-15 LAB — MICROALBUMIN / CREATININE URINE RATIO
Creatinine,U: 116 mg/dL
Microalb Creat Ratio: 0.6 mg/g (ref 0.0–30.0)
Microalb, Ur: <0.7 mg/dL (ref 0.0–1.9)

## 2021-03-01 ENCOUNTER — Ambulatory Visit
Admission: RE | Admit: 2021-03-01 | Discharge: 2021-03-01 | Disposition: A | Discharge status: Home / Self Care | Payer: 59 | Source: Ambulatory Visit | Attending: Family | Admitting: Family

## 2021-03-01 DIAGNOSIS — R1032 Left lower quadrant pain: Secondary | ICD-10-CM

## 2021-03-01 MED ORDER — IOPAMIDOL (ISOVUE-300) INJECTION 61%
100.0000 mL | Freq: Once | INTRAVENOUS | Status: AC | PRN
  Administered 2021-03-01: 100 mL via INTRAVENOUS

## 2021-09-03 IMAGING — CR DG THORACIC SPINE 2V
3 series · 3 of 3 positions shown · non-contrast
Comparison: None.

CLINICAL DATA: Back pain, history of back spasm, heavy lifting

EXAM:
THORACIC SPINE 2 VIEWS

[t thoracic spine ap]
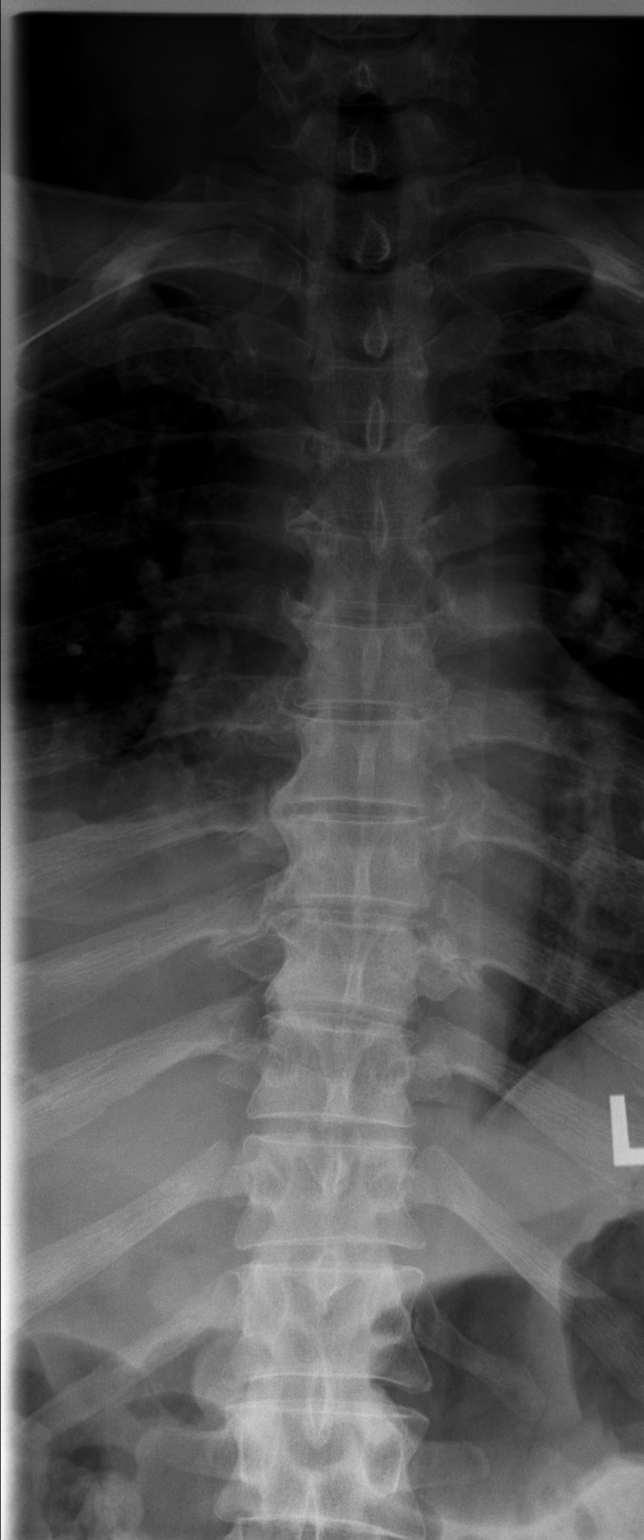

[t thoracic spine lat]
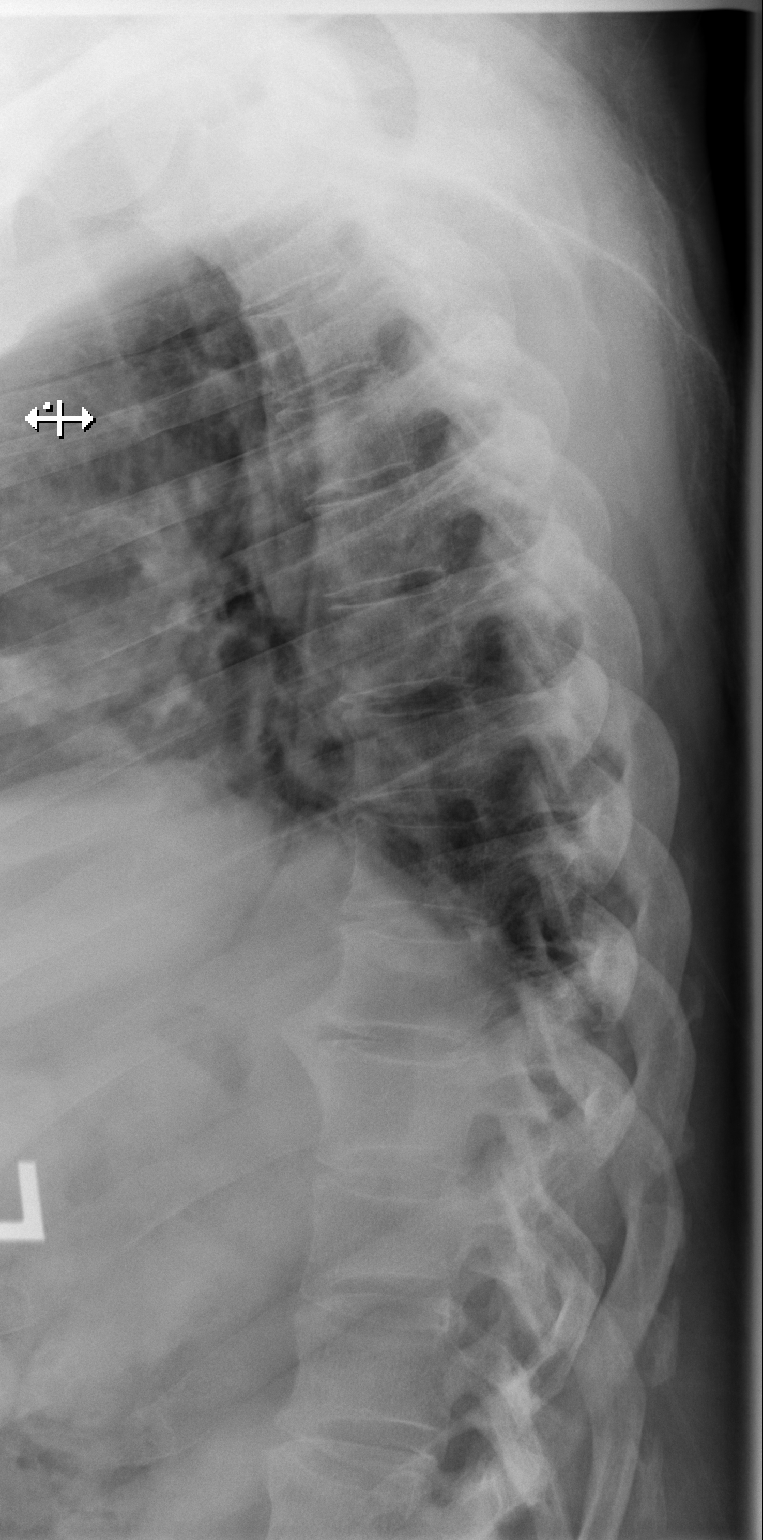

[t thoracic swimmers]
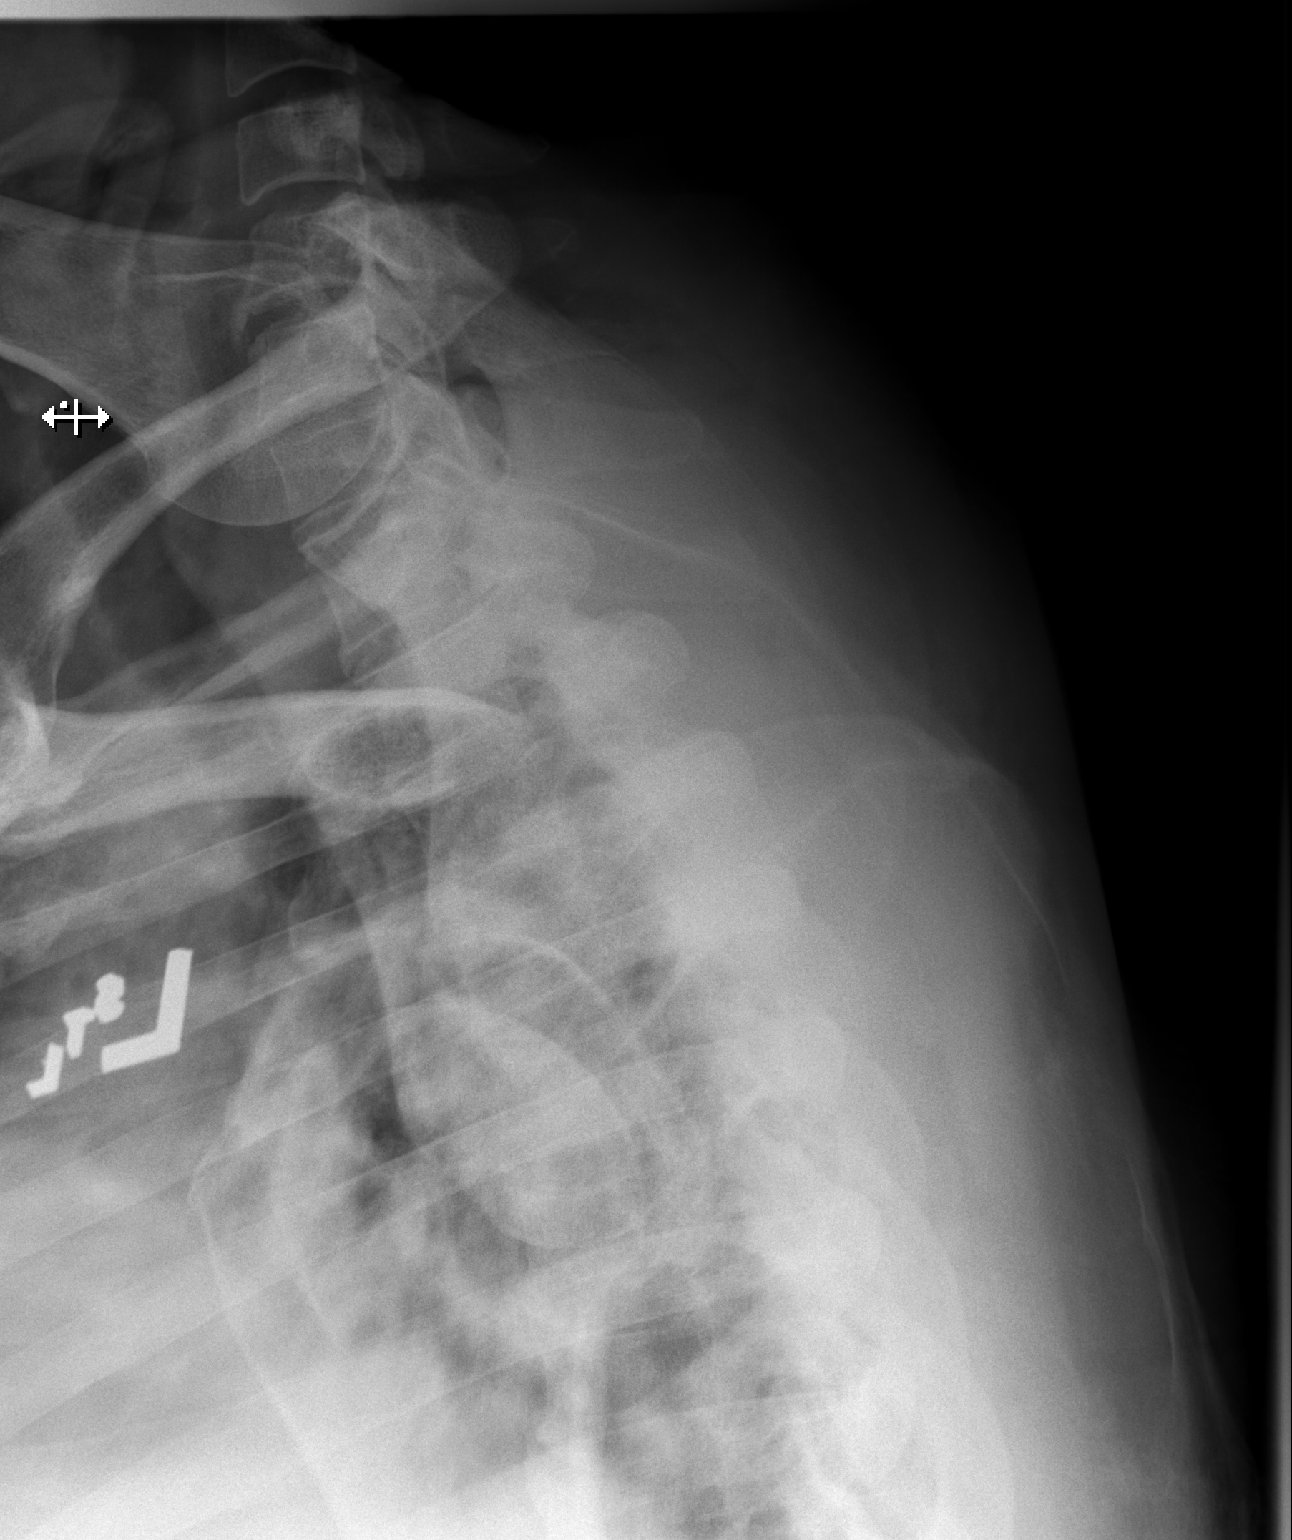

[3 of 3 positions shown; findings below may reference images not displayed]

FINDINGS: Preservation of the normal thoracic kyphosis. Upper thoracic levels
better visualized on swimmer's view. No acute fracture or vertebral
body height loss is seen. No other acute fracture or suspicious
osseous lesion. No traumatic listhesis. Mild multilevel
intervertebral disc height loss with early spondylitic changes most
pronounced at the T8-9 level with some anterior osteophytosis.
IMPRESSION: No acute osseous abnormality.

Mild multilevel degenerative disc disease in the thoracic spine,
most pronounced about the T8-9 level.

## 2021-09-27 ENCOUNTER — Encounter: Payer: 59 | Admitting: Emergency Medicine

## 2021-10-04 ENCOUNTER — Ambulatory Visit: Payer: 59 | Admitting: Emergency Medicine

## 2021-10-04 ENCOUNTER — Other Ambulatory Visit: Payer: Self-pay

## 2021-10-04 ENCOUNTER — Encounter: Payer: Self-pay | Admitting: Emergency Medicine

## 2021-10-04 VITALS — BP 122/70 | HR 72 | Ht 69.0 in | Wt 301.0 lb

## 2021-10-04 DIAGNOSIS — E119 Type 2 diabetes mellitus without complications: Secondary | ICD-10-CM

## 2021-10-04 DIAGNOSIS — E785 Hyperlipidemia, unspecified: Secondary | ICD-10-CM

## 2021-10-04 DIAGNOSIS — E7800 Pure hypercholesterolemia, unspecified: Secondary | ICD-10-CM

## 2021-10-04 DIAGNOSIS — G4733 Obstructive sleep apnea (adult) (pediatric): Secondary | ICD-10-CM

## 2021-10-04 DIAGNOSIS — N401 Enlarged prostate with lower urinary tract symptoms: Secondary | ICD-10-CM | POA: Diagnosis not present

## 2021-10-04 DIAGNOSIS — E1169 Type 2 diabetes mellitus with other specified complication: Secondary | ICD-10-CM

## 2021-10-04 DIAGNOSIS — K219 Gastro-esophageal reflux disease without esophagitis: Secondary | ICD-10-CM

## 2021-10-04 DIAGNOSIS — Z7689 Persons encountering health services in other specified circumstances: Secondary | ICD-10-CM

## 2021-10-04 MED ORDER — TAMSULOSIN HCL 0.4 MG PO CAPS: 0.4000 mg | ORAL_CAPSULE | Freq: Every day | ORAL | 3 refills | Status: DC

## 2021-10-04 MED ORDER — ROSUVASTATIN CALCIUM 20 MG PO TABS: 20.0000 mg | ORAL_TABLET | Freq: Every day | ORAL | 3 refills | Status: AC

## 2021-10-04 NOTE — Assessment & Plan Note (Signed)
Diet and nutrition discussed.  Continue rosuvastatin 20 mg daily. 

## 2021-10-04 NOTE — Patient Instructions (Signed)
Health Maintenance, Male Adopting a healthy lifestyle and getting preventive care are important in promoting health and wellness. Ask your health care provider about: The right schedule for you to have regular tests and exams. Things you can do on your own to prevent diseases and keep yourself healthy. What should I know about diet, weight, and exercise? Eat a healthy diet  Eat a diet that includes plenty of vegetables, fruits, low-fat dairy products, and lean protein. Do not eat a lot of foods that are high in solid fats, added sugars, or sodium. Maintain a healthy weight Body mass index (BMI) is a measurement that can be used to identify possible weight problems. It estimates body fat based on height and weight. Your health care provider can help determine your BMI and help you achieve or maintain a healthy weight. Get regular exercise Get regular exercise. This is one of the most important things you can do for your health. Most adults should: Exercise for at least 150 minutes each week. The exercise should increase your heart rate and make you sweat (moderate-intensity exercise). Do strengthening exercises at least twice a week. This is in addition to the moderate-intensity exercise. Spend less time sitting. Even light physical activity can be beneficial. Watch cholesterol and blood lipids Have your blood tested for lipids and cholesterol at 53 years of age, then have this test every 5 years. You may need to have your cholesterol levels checked more often if: Your lipid or cholesterol levels are high. You are older than 53 years of age. You are at high risk for heart disease. What should I know about cancer screening? Many types of cancers can be detected early and may often be prevented. Depending on your health history and family history, you may need to have cancer screening at various ages. This may include screening for: Colorectal cancer. Prostate cancer. Skin cancer. Lung  cancer. What should I know about heart disease, diabetes, and high blood pressure? Blood pressure and heart disease High blood pressure causes heart disease and increases the risk of stroke. This is more likely to develop in people who have high blood pressure readings, are of African descent, or are overweight. Talk with your health care provider about your target blood pressure readings. Have your blood pressure checked: Every 3-5 years if you are 18-39 years of age. Every year if you are 40 years old or older. If you are between the ages of 65 and 75 and are a current or former smoker, ask your health care provider if you should have a one-time screening for abdominal aortic aneurysm (AAA). Diabetes Have regular diabetes screenings. This checks your fasting blood sugar level. Have the screening done: Once every three years after age 45 if you are at a normal weight and have a low risk for diabetes. More often and at a younger age if you are overweight or have a high risk for diabetes. What should I know about preventing infection? Hepatitis B If you have a higher risk for hepatitis B, you should be screened for this virus. Talk with your health care provider to find out if you are at risk for hepatitis B infection. Hepatitis C Blood testing is recommended for: Everyone born from 1945 through 1965. Anyone with known risk factors for hepatitis C. Sexually transmitted infections (STIs) You should be screened each year for STIs, including gonorrhea and chlamydia, if: You are sexually active and are younger than 53 years of age. You are older than 53 years   of age and your health care provider tells you that you are at risk for this type of infection. Your sexual activity has changed since you were last screened, and you are at increased risk for chlamydia or gonorrhea. Ask your health care provider if you are at risk. Ask your health care provider about whether you are at high risk for HIV.  Your health care provider may recommend a prescription medicine to help prevent HIV infection. If you choose to take medicine to prevent HIV, you should first get tested for HIV. You should then be tested every 3 months for as long as you are taking the medicine. Follow these instructions at home: Lifestyle Do not use any products that contain nicotine or tobacco, such as cigarettes, e-cigarettes, and chewing tobacco. If you need help quitting, ask your health care provider. Do not use street drugs. Do not share needles. Ask your health care provider for help if you need support or information about quitting drugs. Alcohol use Do not drink alcohol if your health care provider tells you not to drink. If you drink alcohol: Limit how much you have to 0-2 drinks a day. Be aware of how much alcohol is in your drink. In the [place] one drink equals one 12 oz bottle of beer (355 mL), one 5 oz glass of wine (148 mL), or one 1 oz glass of hard liquor (44 mL). General instructions Schedule regular health, dental, and eye exams. Stay current with your vaccines. Tell your health care provider if: You often feel depressed. You have ever been abused or do not feel safe at home. Summary Adopting a healthy lifestyle and getting preventive care are important in promoting health and wellness. Follow your health care provider's instructions about healthy diet, exercising, and getting tested or screened for diseases. Follow your health care provider's instructions on monitoring your cholesterol and blood pressure. This information is not intended to replace advice given to you by your health care provider. Make sure you discuss any questions you have with your health care provider. Document Revised: 02/03/2021 Document Reviewed: 11/19/2018 Elsevier Patient Education  2022 Elsevier Inc.  

## 2021-10-04 NOTE — Assessment & Plan Note (Signed)
Diet controlled.  

## 2021-10-04 NOTE — Assessment & Plan Note (Signed)
Stable.  On CPAP treatment. 

## 2021-10-04 NOTE — Progress Notes (Signed)
Collin Murphy 55 y.o.   Chief Complaint  Patient presents with   Transitions Of Care    HISTORY OF PRESENT ILLNESS: This is a 55 y.o. male here to establish care with me.  Used to see Marvis Repress, NP. Has the following chronic medical problems: 1.  Dyslipidemia on rosuvastatin 20 mg daily 2.  Obstructive sleep apnea on CPAP treatment 3.  Diabetes diet controlled.  No medication at present time. 4.  History of GERD. No complaints or medical concerns today.  HPI   Prior to Admission medications   Medication Sig Start Date End Date Taking? Authorizing Provider  cetirizine (ZYRTEC) 10 MG tablet Take 10 mg by mouth daily as needed for allergies.   Yes [provider]  diclofenac (VOLTAREN) 50 MG EC tablet Take 50 mg by mouth 2 (two) times daily as needed for mild pain.    Yes [provider]  fexofenadine (ALLEGRA) 180 MG tablet Take 180 mg by mouth daily as needed for allergies or rhinitis.   Yes [provider]  furosemide (LASIX) 20 MG tablet Take 1 tablet (20 mg total) by mouth daily as needed for fluid or edema. 06/22/20  Yes Marrian Salvage, FNP  rosuvastatin (CRESTOR) 20 MG tablet Take 1 tablet (20 mg total) by mouth daily. 12/16/19  Yes Marrian Salvage, FNP  tamsulosin (FLOMAX) 0.4 MG CAPS capsule Take 1 capsule (0.4 mg total) by mouth at bedtime. 05/18/20  Yes Marrian Salvage, FNP  Triamcinolone Acetonide (NASACORT AQ NA) Place 1 spray into the nose daily as needed (allergies).    Yes [provider]    No Known Allergies  Patient Active Problem List   Diagnosis Date Noted   Diabetes mellitus without complication ([facility]) 33/82/5053   Bilateral lower extremity edema 06/28/2016   Obesity, Class III, BMI 40-49.9 (morbid obesity) ([facility]) 03/05/2013   Obstructive sleep apnea 09/21/2009   HYPERCHOLESTEROLEMIA 08/26/2009   Gastroesophageal reflux disease 09/03/2007    Past Medical History:  Diagnosis Date   ALLERGIC  RHINITIS 09/03/2007   Allergy    GERD 09/03/2007   HYPERCHOLESTEROLEMIA 08/26/2009   HYPERSOMNIA 08/26/2009   OBSTRUCTIVE SLEEP APNEA 09/21/2009   Prostatitis    multiple episodes   Sleep apnea    CPAP use sometimes   TINEA PEDIS 09/03/2007    Past Surgical History:  Procedure Laterality Date   TOE AMPUTATION     3rd and 4th toes amputated after forklift injury    Social History   Socioeconomic History   Marital status: Married    Spouse name: Not on file   Number of children: 2   Years of education: 16   Highest education level: Not on file  Occupational History   Occupation: Mental health and Information systems manager    Employer: [employer]  Tobacco Use   Smoking status: Light Smoker    Types: Cigars   Smokeless tobacco: Never   Tobacco comments:    Very rarely. - 2x ayear   Vaping Use   Vaping Use: Never used  Substance and Sexual Activity   Alcohol use: Yes    Comment: maybe monthly per pt.   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 Completed [employer]. Married-1990   Children : 1 boy '93, 1 girl '98. Work Engineer, mining.  Social Determinants of Health   Financial Resource Strain: Not on file  Food Insecurity: Not on file  Transportation Needs: Not on file  Physical Activity: Not on file  Stress: Not on file  Social Connections: Not on file  Intimate Partner Violence: Not on file    Family History  Problem Relation Age of Onset   Hypertension Mother    Heart disease Mother        CAD   Diabetes Father    Gout Brother    COPD Brother    Cancer Neg Hx        No history Colon, Prostate Breast CA   Colon cancer Neg Hx    Esophageal cancer Neg Hx    Stomach cancer Neg Hx      Review of Systems  Constitutional: Negative.  Negative for chills and fever.  HENT: Negative.  Negative for congestion and sore throat.    Respiratory: Negative.  Negative for cough and shortness of breath.   Cardiovascular: Negative.  Negative for chest pain and palpitations.  Gastrointestinal: Negative.  Negative for abdominal pain, diarrhea, nausea and vomiting.  Genitourinary: Negative.   Skin: Negative.  Negative for rash.  Neurological: Negative.  Negative for dizziness and headaches.  All other systems reviewed and are negative.  Today's Vitals   10/04/21 1547  BP: 122/70  Pulse: 72  SpO2: 98%  Weight: (!) 301 lb (136.5 kg)  Height: 5\' 9"  (1.753 m)   Body mass index is 44.45 kg/m. Wt Readings from Last 3 Encounters:  10/04/21 (!) 301 lb (136.5 kg)  02/13/21 296 lb (134.3 kg)  06/22/20 284 lb 9.6 oz (129.1 kg)    Physical Exam Vitals reviewed.  Constitutional:      Appearance: Normal appearance.  HENT:     Head: Normocephalic.  Eyes:     Extraocular Movements: Extraocular movements intact.     Pupils: Pupils are equal, round, and reactive to light.  Cardiovascular:     Rate and Rhythm: Normal rate and regular rhythm.     Pulses: Normal pulses.     Heart sounds: Normal heart sounds.  Pulmonary:     Effort: Pulmonary effort is normal.     Breath sounds: Normal breath sounds.  Musculoskeletal:     Cervical back: Normal range of motion.  Skin:    General: Skin is warm and dry.     Capillary Refill: Capillary refill takes less than 2 seconds.  Neurological:     General: No focal deficit present.     Mental Status: He is alert and oriented to person, place, and time.  Psychiatric:        Mood and Affect: Mood normal.        Behavior: Behavior normal.     ASSESSMENT & PLAN: Problem List Items Addressed This Visit       Respiratory   Obstructive sleep apnea    Stable.  On CPAP treatment.        Digestive   Gastroesophageal reflux disease    Stable.  Asymptomatic.        Endocrine   Diabetes mellitus without complication ([facility])    Diet controlled.      Relevant Medications    rosuvastatin (CRESTOR) 20 MG tablet     Genitourinary   Benign prostatic hyperplasia with lower urinary tract symptoms    Symptoms well controlled with Flomax 0.4 mg at bedtime.      Relevant Medications   tamsulosin (FLOMAX) 0.4 MG CAPS capsule  Other   HYPERCHOLESTEROLEMIA    Diet and nutrition discussed.  Continue rosuvastatin 20 mg daily.      Relevant Medications   rosuvastatin (CRESTOR) 20 MG tablet   Other Visit Diagnoses     Dyslipidemia associated with type 2 diabetes mellitus ([facility])    -  Primary   Relevant Medications   rosuvastatin (CRESTOR) 20 MG tablet   Other Relevant Orders   CBC with Differential/Platelet   Comprehensive metabolic panel   Hemoglobin A1c   Lipid panel   Dyslipidemia       Relevant Medications   rosuvastatin (CRESTOR) 20 MG tablet   Diet-controlled type 2 diabetes mellitus ([facility])       Relevant Medications   rosuvastatin (CRESTOR) 20 MG tablet   Encounter to establish care          Patient Instructions  Health Maintenance, Male Adopting a healthy lifestyle and getting preventive care are important in promoting health and wellness. Ask your health care provider about: The right schedule for you to have regular tests and exams. Things you can do on your own to prevent diseases and keep yourself healthy. What should I know about diet, weight, and exercise? Eat a healthy diet  Eat a diet that includes plenty of vegetables, fruits, low-fat dairy products, and lean protein. Do not eat a lot of foods that are high in solid fats, added sugars, or sodium. Maintain a healthy weight Body mass index (BMI) is a measurement that can be used to identify possible weight problems. It estimates body fat based on height and weight. Your health care provider can help determine your BMI and help you achieve or maintain a healthy weight. Get regular exercise Get regular exercise. This is one of the most important things you can do for your health. Most  adults should: Exercise for at least 150 minutes each week. The exercise should increase your heart rate and make you sweat (moderate-intensity exercise). Do strengthening exercises at least twice a week. This is in addition to the moderate-intensity exercise. Spend less time sitting. Even light physical activity can be beneficial. Watch cholesterol and blood lipids Have your blood tested for lipids and cholesterol at 55 years of age, then have this test every 5 years. You may need to have your cholesterol levels checked more often if: Your lipid or cholesterol levels are high. You are older than 53 years of age. You are at high risk for heart disease. What should I know about cancer screening? Many types of cancers can be detected early and may often be prevented. Depending on your health history and family history, you may need to have cancer screening at various ages. This may include screening for: Colorectal cancer. Prostate cancer. Skin cancer. Lung cancer. What should I know about heart disease, diabetes, and high blood pressure? Blood pressure and heart disease High blood pressure causes heart disease and increases the risk of stroke. This is more likely to develop in people who have high blood pressure readings, are of African descent, or are overweight. Talk with your health care provider about your target blood pressure readings. Have your blood pressure checked: Every 3-5 years if you are 25-39 years of age. Every year if you are 80 years old or older. If you are between the ages of 43 and 64 and are a current or former smoker, ask your health care provider if you should have a one-time screening for abdominal aortic aneurysm (AAA). Diabetes Have regular diabetes screenings. This checks  your fasting blood sugar level. Have the screening done: Once every three years after age 44 if you are at a normal weight and have a low risk for diabetes. More often and at a younger age if  you are overweight or have a high risk for diabetes. What should I know about preventing infection? Hepatitis B If you have a higher risk for hepatitis B, you should be screened for this virus. Talk with your health care provider to find out if you are at risk for hepatitis B infection. Hepatitis C Blood testing is recommended for: Everyone born from [birth year] through 1965. Anyone with known risk factors for hepatitis C. Sexually transmitted infections (STIs) You should be screened each year for STIs, including gonorrhea and chlamydia, if: You are sexually active and are younger than 53 years of age. You are older than 53 years of age and your health care provider tells you that you are at risk for this type of infection. Your sexual activity has changed since you were last screened, and you are at increased risk for chlamydia or gonorrhea. Ask your health care provider if you are at risk. Ask your health care provider about whether you are at high risk for HIV. Your health care provider may recommend a prescription medicine to help prevent HIV infection. If you choose to take medicine to prevent HIV, you should first get tested for HIV. You should then be tested every 3 months for as long as you are taking the medicine. Follow these instructions at home: Lifestyle Do not use any products that contain nicotine or tobacco, such as cigarettes, e-cigarettes, and chewing tobacco. If you need help quitting, ask your health care provider. Do not use street drugs. Do not share needles. Ask your health care provider for help if you need support or information about quitting drugs. Alcohol use Do not drink alcohol if your health care provider tells you not to drink. If you drink alcohol: Limit how much you have to 0-2 drinks a day. Be aware of how much alcohol is in your drink. In the [place] one drink equals one 12 oz bottle of beer (355 mL), one 5 oz glass of wine (148 mL), or one 1 oz glass of hard  liquor (44 mL). General instructions Schedule regular health, dental, and eye exams. Stay current with your vaccines. Tell your health care provider if: You often feel depressed. You have ever been abused or do not feel safe at home. Summary Adopting a healthy lifestyle and getting preventive care are important in promoting health and wellness. Follow your health care provider's instructions about healthy diet, exercising, and getting tested or screened for diseases. Follow your health care provider's instructions on monitoring your cholesterol and blood pressure. This information is not intended to replace advice given to you by your health care provider. Make sure you discuss any questions you have with your health care provider. Document Revised: 02/03/2021 Document Reviewed: 11/19/2018 Elsevier Patient Education  2022 [place], MD [facility]

## 2021-10-04 NOTE — Assessment & Plan Note (Signed)
Symptoms well controlled with Flomax 0.4 mg at bedtime.

## 2021-10-04 NOTE — Assessment & Plan Note (Signed)
Stable. Asymptomatic.

## 2021-10-05 LAB — COMPREHENSIVE METABOLIC PANEL
ALT: 21 U/L (ref 0–53)
AST: 22 U/L (ref 0–37)
Albumin: 4.4 g/dL (ref 3.5–5.2)
Alkaline Phosphatase: 69 U/L (ref 39–117)
BUN: 12 mg/dL (ref 6–23)
CO2: 32 mEq/L (ref 19–32)
Calcium: 9.3 mg/dL (ref 8.4–10.5)
Chloride: 102 mEq/L (ref 96–112)
Creatinine, Ser: 1.07 mg/dL (ref 0.40–1.50)
GFR: 79.17 mL/min (ref >60.00)
Glucose, Bld: 127 mg/dL — ABNORMAL HIGH (ref 70–99)
Potassium: 3.8 mEq/L (ref 3.5–5.1)
Sodium: 139 mEq/L (ref 135–145)
Total Bilirubin: 0.4 mg/dL (ref 0.2–1.2)
Total Protein: 7.6 g/dL (ref 6.0–8.3)

## 2021-10-05 LAB — CBC WITH DIFFERENTIAL/PLATELET
Basophils Absolute: 0 10*3/uL (ref 0.0–0.1)
Basophils Relative: 0.5 % (ref 0.0–3.0)
Eosinophils Absolute: 0.1 10*3/uL (ref 0.0–0.7)
Eosinophils Relative: 1.8 % (ref 0.0–5.0)
HCT: 39.4 % (ref 39.0–52.0)
Hemoglobin: 12.9 g/dL — ABNORMAL LOW (ref 13.0–17.0)
Lymphocytes Relative: 37.2 % (ref 12.0–46.0)
Lymphs Abs: 2.8 10*3/uL (ref 0.7–4.0)
MCHC: 32.8 g/dL (ref 30.0–36.0)
MCV: 88 fl (ref 78.0–100.0)
Monocytes Absolute: 0.6 10*3/uL (ref 0.1–1.0)
Monocytes Relative: 7.4 % (ref 3.0–12.0)
Neutro Abs: 4 10*3/uL (ref 1.4–7.7)
Neutrophils Relative %: 53.1 % (ref 43.0–77.0)
Platelets: 230 10*3/uL (ref 150.0–400.0)
RBC: 4.47 Mil/uL (ref 4.22–5.81)
RDW: 13.8 % (ref 11.5–15.5)
WBC: 7.6 10*3/uL (ref 4.0–10.5)

## 2021-10-05 LAB — LIPID PANEL
Cholesterol: 190 mg/dL (ref 0–200)
HDL: 49.5 mg/dL (ref >39.00)
LDL Cholesterol: 113 mg/dL — ABNORMAL HIGH (ref 0–99)
NonHDL: 140.9
Total CHOL/HDL Ratio: 4
Triglycerides: 140 mg/dL (ref 0.0–149.0)
VLDL: 28 mg/dL (ref 0.0–40.0)

## 2021-10-05 LAB — HEMOGLOBIN A1C: Hgb A1c MFr Bld: 6.5 % (ref 4.6–6.5)

## 2021-10-06 ENCOUNTER — Other Ambulatory Visit: Payer: Self-pay | Admitting: Emergency Medicine

## 2021-10-06 DIAGNOSIS — E1169 Type 2 diabetes mellitus with other specified complication: Secondary | ICD-10-CM

## 2021-10-06 MED ORDER — METFORMIN HCL 500 MG PO TABS: 500.0000 mg | ORAL_TABLET | Freq: Two times a day (BID) | ORAL | 3 refills | Status: AC

## 2021-10-06 NOTE — Progress Notes (Signed)
The 10-year ASCVD risk score (Arnett DK, et al., 2019) is: 17.2%   Values used to calculate the score:     Age: 55 years     Sex: Male     Is Non-Hispanic African American: Yes     Diabetic: Yes     Tobacco smoker: Yes     Systolic Blood Pressure: 202 mmHg     Is BP treated: No     HDL Cholesterol: 49.5 mg/dL     Total Cholesterol: 190 mg/dL

## 2021-10-18 ENCOUNTER — Encounter (HOSPITAL_COMMUNITY): Payer: Self-pay

## 2021-10-18 ENCOUNTER — Other Ambulatory Visit: Payer: Self-pay

## 2021-10-18 ENCOUNTER — Ambulatory Visit (HOSPITAL_COMMUNITY)
Admission: EM | Admit: 2021-10-18 | Discharge: 2021-10-18 | Disposition: A | Discharge status: Home / Self Care | Payer: 59 | Attending: Family Medicine | Admitting: Family Medicine

## 2021-10-18 DIAGNOSIS — J029 Acute pharyngitis, unspecified: Secondary | ICD-10-CM

## 2021-10-18 DIAGNOSIS — J069 Acute upper respiratory infection, unspecified: Secondary | ICD-10-CM

## 2021-10-18 MED ORDER — PROMETHAZINE-DM 6.25-15 MG/5ML PO SYRP: 5.0000 mL | ORAL_SOLUTION | Freq: Four times a day (QID) | ORAL | 0 refills | Status: DC | PRN

## 2021-10-18 NOTE — ED Triage Notes (Signed)
Pt presents with complaints of cough and back soreness. Reports back soreness has been going on for awhile.

## 2021-10-19 NOTE — ED Provider Notes (Signed)
[name]   [id number] 10/18/21 Arrival Time: 8756  ASSESSMENT & PLAN:  1. Viral upper respiratory tract infection   2. Sore throat    Discussed typical duration of viral illnesses. OTC symptom care as needed.  Meds ordered this encounter  Medications   promethazine-dextromethorphan (PROMETHAZINE-DM) 6.25-15 MG/5ML syrup    Sig: Take 5 mLs by mouth 4 (four) times daily as needed for cough.    Dispense:  118 mL    Refill:  0     Follow-up Information     Sagardia, Ines Bloomer, MD.   Specialty: Internal Medicine Why: If worsening or failing to improve as anticipated. Contact information: [place] [address] [phone]                 Reviewed expectations re: course of current medical issues. Questions answered. Outlined signs and symptoms indicating need for more acute intervention. Understanding verbalized. After Visit Summary given.   SUBJECTIVE: History from: patient. Collin Murphy is a 55 y.o. male who reports: cough, body aches (specifically lower back); mild ST. Abrupt onset; few days; fatigued. Denies: fever. Normal PO intake without n/v/d.   OBJECTIVE:  Vitals:   10/18/21 1945  BP: (!) 162/101  Pulse: 83  Resp: 19  Temp: 98.2 F (36.8 C)  SpO2: 95%    General appearance: alert; no distress Eyes: PERRLA; EOMI; conjunctiva normal HENT: [place]; AT; with mild nasal congestion and throat irritation Neck: supple  Lungs: speaks full sentences without difficulty; unlabored Extremities: no edema Skin: warm and dry Neurologic: normal gait Psychological: alert and cooperative; normal mood and affect   No Known Allergies  Past Medical History:  Diagnosis Date   ALLERGIC RHINITIS 09/03/2007   Allergy    GERD 09/03/2007   HYPERCHOLESTEROLEMIA 08/26/2009   HYPERSOMNIA 08/26/2009   OBSTRUCTIVE SLEEP APNEA 09/21/2009   Prostatitis    multiple episodes   Sleep apnea    CPAP use sometimes   TINEA PEDIS  09/03/2007   Social History   Socioeconomic History   Marital status: Married    Spouse name: Not on file   Number of children: 2   Years of education: 16   Highest education level: Not on file  Occupational History   Occupation: Mental health and Information systems manager    Employer: [employer]  Tobacco Use   Smoking status: Light Smoker    Types: Cigars   Smokeless tobacco: Never   Tobacco comments:    Very rarely. - 2x ayear   Vaping Use   Vaping Use: Never used  Substance and Sexual Activity   Alcohol use: Yes    Comment: maybe monthly per pt.   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 Completed [employer]. Married-1990   Children : 1 boy '93, 1 girl '98. Work Engineer, mining.            Social Determinants of Health   Financial Resource Strain: Not on file  Food Insecurity: Not on file  Transportation Needs: Not on file  Physical Activity: Not on file  Stress: Not on file  Social Connections: Not on file  Intimate Partner Violence: Not on file   Family History  Problem Relation Age of Onset   Hypertension Mother    Heart disease Mother        CAD   Diabetes  Father    Gout Brother    COPD Brother    Cancer Neg Hx        No history Colon, Prostate Breast CA   Colon cancer Neg Hx    Esophageal cancer Neg Hx    Stomach cancer Neg Hx    Past Surgical History:  Procedure Laterality Date   TOE AMPUTATION     3rd and 4th toes amputated after forklift injury     Vanessa Kick, MD 10/19/21 [phone]

## 2021-10-22 ENCOUNTER — Encounter: Payer: Self-pay | Admitting: Emergency Medicine

## 2021-10-23 ENCOUNTER — Ambulatory Visit: Payer: 59 | Admitting: Adult Health

## 2021-10-23 ENCOUNTER — Other Ambulatory Visit: Payer: Self-pay

## 2021-10-23 ENCOUNTER — Encounter: Payer: Self-pay | Admitting: Adult Health

## 2021-10-23 ENCOUNTER — Ambulatory Visit (INDEPENDENT_AMBULATORY_CARE_PROVIDER_SITE_OTHER): Payer: 59

## 2021-10-23 VITALS — BP 150/88 | HR 94 | Temp 96.7°F | Ht 69.02 in | Wt 298.2 lb

## 2021-10-23 DIAGNOSIS — R051 Acute cough: Secondary | ICD-10-CM | POA: Diagnosis not present

## 2021-10-23 DIAGNOSIS — Z87898 Personal history of other specified conditions: Secondary | ICD-10-CM | POA: Insufficient documentation

## 2021-10-23 DIAGNOSIS — J019 Acute sinusitis, unspecified: Secondary | ICD-10-CM | POA: Insufficient documentation

## 2021-10-23 MED ORDER — ALBUTEROL SULFATE HFA 108 (90 BASE) MCG/ACT IN AERS: 2.0000 | INHALATION_SPRAY | Freq: Four times a day (QID) | RESPIRATORY_TRACT | 0 refills | Status: DC | PRN

## 2021-10-23 MED ORDER — PREDNISONE 20 MG PO TABS: 40.0000 mg | ORAL_TABLET | Freq: Every day | ORAL | 0 refills | Status: AC

## 2021-10-23 MED ORDER — AMOXICILLIN-POT CLAVULANATE 875-125 MG PO TABS: 1.0000 | ORAL_TABLET | Freq: Two times a day (BID) | ORAL | 0 refills | Status: DC

## 2021-10-23 MED ORDER — BENZONATATE 100 MG PO CAPS: 100.0000 mg | ORAL_CAPSULE | Freq: Two times a day (BID) | ORAL | 0 refills | Status: DC | PRN

## 2021-10-23 NOTE — Progress Notes (Signed)
Acute Office Visit  Subjective:    Patient ID: Collin Murphy, male    DOB: [birth year], 55 y.o.   MRN: [id number]  Chief Complaint  Patient presents with   Cough    Pt has had cough since 11/9. Tested for covid 11/10 at home, came back negative    HPI Patient is in today for cough that onset was 10/16/21, he denies any fever,productive thick green sputum.  Non smoker reported. - cigar occasionally.  Is not taking PRN lasix.  Denies shortness of breath. Mild wheezing with congestion.  Sore throat since 10/16/21 painful swallowing sinus pressure, dental generalized pain. Denies any problems swallowing. Was seen 10/18/2021 in urgent care diagnosed with viral upper respiratory infection and sore throat. Given promethazine -DM cough syrup as directed.   Patient  denies any fever, body aches,chills, rash, chest pain, shortness of breath, nausea, vomiting, or diarrhea.  Denies any edema.   Past Medical History:  Diagnosis Date   ALLERGIC RHINITIS 09/03/2007   Allergy    GERD 09/03/2007   HYPERCHOLESTEROLEMIA 08/26/2009   HYPERSOMNIA 08/26/2009   OBSTRUCTIVE SLEEP APNEA 09/21/2009   Prostatitis    multiple episodes   Sleep apnea    CPAP use sometimes   TINEA PEDIS 09/03/2007    Past Surgical History:  Procedure Laterality Date   TOE AMPUTATION     3rd and 4th toes amputated after forklift injury    Family History  Problem Relation Age of Onset   Hypertension Mother    Heart disease Mother        CAD   Diabetes Father    Gout Brother    COPD Brother    Cancer Neg Hx        No history Colon, Prostate Breast CA   Colon cancer Neg Hx    Esophageal cancer Neg Hx    Stomach cancer Neg Hx     Social History   Socioeconomic History   Marital status: Married    Spouse name: Not on file   Number of children: 2   Years of education: 16   Highest education level: Not on file  Occupational History   Occupation: Mental health and Information systems manager    Employer: [employer]  Tobacco Use   Smoking status: Light Smoker    Types: Cigars   Smokeless tobacco: Never   Tobacco comments:    Very rarely. - 2x ayear   Vaping Use   Vaping Use: Never used  Substance and Sexual Activity   Alcohol use: Yes    Comment: maybe monthly per pt.   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 Completed [employer]. Married-1990   Children : 1 boy '93, 1 girl '98. Work Engineer, mining.            Social Determinants of Health   Financial Resource Strain: Not on file  Food Insecurity: Not on file  Transportation Needs: Not on file  Physical Activity: Not on file  Stress: Not on file  Social Connections: Not on file  Intimate Partner Violence: Not on file    Outpatient Medications Prior to Visit  Medication Sig Dispense Refill   cetirizine (ZYRTEC) 10 MG tablet Take 10 mg by mouth daily as needed for allergies.     diclofenac (VOLTAREN) 50 MG EC tablet Take 50 mg by mouth 2 (  two) times daily as needed for mild pain.      fexofenadine (ALLEGRA) 180 MG tablet Take 180 mg by mouth daily as needed for allergies or rhinitis.     furosemide (LASIX) 20 MG tablet Take 1 tablet (20 mg total) by mouth daily as needed for fluid or edema. 30 tablet 0   metFORMIN (GLUCOPHAGE) 500 MG tablet Take 1 tablet (500 mg total) by mouth 2 (two) times daily with a meal. 180 tablet 3   promethazine-dextromethorphan (PROMETHAZINE-DM) 6.25-15 MG/5ML syrup Take 5 mLs by mouth 4 (four) times daily as needed for cough. 118 mL 0   rosuvastatin (CRESTOR) 20 MG tablet Take 1 tablet (20 mg total) by mouth daily. 90 tablet 3   tamsulosin (FLOMAX) 0.4 MG CAPS capsule Take 1 capsule (0.4 mg total) by mouth at bedtime. 30 capsule 3   Triamcinolone Acetonide (NASACORT AQ NA) Place 1 spray into the nose daily as needed (allergies).      silodosin (RAPAFLO) 8 MG CAPS capsule Take 8 mg  by mouth daily.     No facility-administered medications prior to visit.    No Known Allergies  Review of Systems  Constitutional:  Positive for fatigue. Negative for activity change, appetite change, chills, diaphoresis, fever and unexpected weight change.  HENT:  Positive for congestion, ear pain, postnasal drip, rhinorrhea, sinus pressure, sinus pain, sore throat and trouble swallowing. Negative for dental problem, drooling, ear discharge, facial swelling, hearing loss, mouth sores, nosebleeds, sneezing, tinnitus and voice change.   Respiratory:  Positive for cough and wheezing. Negative for apnea, choking, chest tightness, shortness of breath and stridor.   Cardiovascular: Negative.   Gastrointestinal: Negative.   Genitourinary: Negative.   Musculoskeletal: Negative.   Skin: Negative.   Neurological: Negative.   Psychiatric/Behavioral: Negative.        Objective:    Physical Exam Vitals reviewed.  Constitutional:      General: He is not in acute distress.    Appearance: He is obese. He is not ill-appearing, toxic-appearing or diaphoretic.  HENT:     Head: Normocephalic and atraumatic.     Jaw: There is normal jaw occlusion.     Salivary Glands: Right salivary gland is not diffusely enlarged or tender. Left salivary gland is not diffusely enlarged or tender.     Right Ear: Hearing, ear canal and external ear normal. A middle ear effusion is present. Tympanic membrane is erythematous. Tympanic membrane is not perforated.     Left Ear: Hearing, ear canal and external ear normal. A middle ear effusion is present. Tympanic membrane is erythematous. Tympanic membrane is not perforated.     Nose: Congestion and rhinorrhea present. Rhinorrhea is clear.     Right Sinus: No maxillary sinus tenderness or frontal sinus tenderness.     Left Sinus: No maxillary sinus tenderness or frontal sinus tenderness.     Mouth/Throat:     Lips: Pink.     Mouth: Mucous membranes are moist. No oral  lesions or angioedema.     Tongue: No lesions. Tongue does not deviate from midline.     Palate: No mass and lesions.     Pharynx: Uvula midline. Posterior oropharyngeal erythema present. No pharyngeal swelling, oropharyngeal exudate or uvula swelling.     Tonsils: No tonsillar exudate or tonsillar abscesses.  Eyes:     Conjunctiva/sclera: Conjunctivae normal.     Pupils: Pupils are equal, round, and reactive to light.  Neck:     Vascular: No carotid bruit.  Cardiovascular:     Rate and Rhythm: Normal rate and regular rhythm.     Pulses: Normal pulses.     Heart sounds: Normal heart sounds. No murmur heard.   No friction rub. No gallop.  Pulmonary:     Effort: Pulmonary effort is normal. No respiratory distress.     Breath sounds: No stridor. Examination of the right-upper field reveals wheezing. Examination of the left-upper field reveals wheezing. Wheezing present. No rhonchi or rales.     Comments: Mild expiratory wheeze Chest:     Chest wall: No tenderness.  Abdominal:     General: There is no distension.     Palpations: Abdomen is soft.     Tenderness: There is no abdominal tenderness. There is no guarding.  Musculoskeletal:        General: Normal range of motion.     Cervical back: Normal range of motion and neck supple. No rigidity or tenderness.     Right lower leg: No edema.     Left lower leg: No edema.  Lymphadenopathy:     Cervical: No cervical adenopathy.  Skin:    General: Skin is warm.  Neurological:     Mental Status: He is alert and oriented to person, place, and time.     Motor: No weakness.     Gait: Gait normal.     Deep Tendon Reflexes: Reflexes normal.  Psychiatric:        Mood and Affect: Mood normal.        Behavior: Behavior normal.        Thought Content: Thought content normal.        Judgment: Judgment normal.    BP (!) 150/88   Pulse 94   Temp (!) 96.7 F (35.9 C)   Ht 5' 9.02" (1.753 m)   Wt 298 lb 3.2 oz (135.3 kg)   SpO2 93%   BMI  44.02 kg/m  Wt Readings from Last 3 Encounters:  10/23/21 298 lb 3.2 oz (135.3 kg)  10/04/21 (!) 301 lb (136.5 kg)  02/13/21 296 lb (134.3 kg)    Health Maintenance Due  Topic Date Due   Pneumococcal Vaccine 25-28 Years old (1 - PCV) Never done   FOOT EXAM  Never done   OPHTHALMOLOGY EXAM  Never done   Hepatitis C Screening  Never done   Zoster Vaccines- Shingrix (1 of 2) Never done   TETANUS/TDAP  08/27/2019   COVID-19 Vaccine (4 - Booster for Pfizer series) 01/30/2021    There are no preventive care reminders to display for this patient.   Lab Results  Component Value Date   TSH 1.78 09/03/2007   Lab Results  Component Value Date   WBC 7.6 10/04/2021   HGB 12.9 (L) 10/04/2021   HCT 39.4 10/04/2021   MCV 88.0 10/04/2021   PLT 230.0 10/04/2021   Lab Results  Component Value Date   NA 139 10/04/2021   K 3.8 10/04/2021   CO2 32 10/04/2021   GLUCOSE 127 (H) 10/04/2021   BUN 12 10/04/2021   CREATININE 1.07 10/04/2021   BILITOT 0.4 10/04/2021   ALKPHOS 69 10/04/2021   AST 22 10/04/2021   ALT 21 10/04/2021   PROT 7.6 10/04/2021   ALBUMIN 4.4 10/04/2021   CALCIUM 9.3 10/04/2021   ANIONGAP 10 05/31/2019   GFR 79.17 10/04/2021   Lab Results  Component Value Date   CHOL 190 10/04/2021   Lab Results  Component Value Date   HDL 49.50 10/04/2021  Lab Results  Component Value Date   LDLCALC 113 (H) 10/04/2021   Lab Results  Component Value Date   TRIG 140.0 10/04/2021   Lab Results  Component Value Date   CHOLHDL 4 10/04/2021   Lab Results  Component Value Date   HGBA1C 6.5 10/04/2021       Assessment & Plan:   Problem List Items Addressed This Visit   None Visit Diagnoses     Acute cough    -  Primary   Relevant Medications   predniSONE (DELTASONE) 20 MG tablet   Other Relevant Orders   DG Chest 2 View   CBC with Differential/Platelet   Comprehensive metabolic panel   B Nat Peptide   Acute non-recurrent sinusitis, unspecified location        Relevant Medications   predniSONE (DELTASONE) 20 MG tablet   amoxicillin-clavulanate (AUGMENTIN) 875-125 MG tablet   benzonatate (TESSALON) 100 MG capsule   History of wheezing       Relevant Medications   predniSONE (DELTASONE) 20 MG tablet   albuterol (VENTOLIN HFA) 108 (90 Base) MCG/ACT inhaler     Given duration will treat for suspected bacterial infection/ sinusitis and bacterial URI. Treatment as reviewed. May also add Mucinex as per package instructions.  Labs and chest x ray today.   Meds ordered this encounter  Medications   predniSONE (DELTASONE) 20 MG tablet    Sig: Take 2 tablets (40 mg total) by mouth daily for 5 days.    Dispense:  10 tablet    Refill:  0   amoxicillin-clavulanate (AUGMENTIN) 875-125 MG tablet    Sig: Take 1 tablet by mouth 2 (two) times daily.    Dispense:  20 tablet    Refill:  0   benzonatate (TESSALON) 100 MG capsule    Sig: Take 1 capsule (100 mg total) by mouth 2 (two) times daily as needed for cough.    Dispense:  20 capsule    Refill:  0   albuterol (VENTOLIN HFA) 108 (90 Base) MCG/ACT inhaler    Sig: Inhale 2 puffs into the lungs every 6 (six) hours as needed for wheezing or shortness of breath.    Dispense:  8 g    Refill:  0   Follow up with Horald Pollen, MD PCP Red Flags discussed. The patient was given clear instructions to go to ER or return to medical center if any red flags develop, symptoms do not improve, worsen or new problems develop. They verbalized understanding.  Return in about 5 days (around 10/28/2021), or if symptoms worsen or fail to improve, for at any time for any worsening symptoms, Go to Emergency room/ urgent care if worse.   Marcille Buffy, FNP

## 2021-10-23 NOTE — Patient Instructions (Signed)

## 2021-10-24 LAB — COMPREHENSIVE METABOLIC PANEL
ALT: 22 U/L (ref 0–53)
AST: 27 U/L (ref 0–37)
Albumin: 4.5 g/dL (ref 3.5–5.2)
Alkaline Phosphatase: 80 U/L (ref 39–117)
BUN: 11 mg/dL (ref 6–23)
CO2: 33 mEq/L — ABNORMAL HIGH (ref 19–32)
Calcium: 9.3 mg/dL (ref 8.4–10.5)
Chloride: 101 mEq/L (ref 96–112)
Creatinine, Ser: 1.05 mg/dL (ref 0.40–1.50)
GFR: 80.96 mL/min (ref >60.00)
Glucose, Bld: 122 mg/dL — ABNORMAL HIGH (ref 70–99)
Potassium: 3.9 mEq/L (ref 3.5–5.1)
Sodium: 140 mEq/L (ref 135–145)
Total Bilirubin: 0.3 mg/dL (ref 0.2–1.2)
Total Protein: 8.2 g/dL (ref 6.0–8.3)

## 2021-10-24 LAB — CBC WITH DIFFERENTIAL/PLATELET
Basophils Absolute: 0.1 10*3/uL (ref 0.0–0.1)
Basophils Relative: 0.7 % (ref 0.0–3.0)
Eosinophils Absolute: 0.3 10*3/uL (ref 0.0–0.7)
Eosinophils Relative: 2.1 % (ref 0.0–5.0)
HCT: 38.9 % — ABNORMAL LOW (ref 39.0–52.0)
Hemoglobin: 12.5 g/dL — ABNORMAL LOW (ref 13.0–17.0)
Lymphocytes Relative: 18.3 % (ref 12.0–46.0)
Lymphs Abs: 2.5 10*3/uL (ref 0.7–4.0)
MCHC: 32.2 g/dL (ref 30.0–36.0)
MCV: 87.3 fl (ref 78.0–100.0)
Monocytes Absolute: 1.5 10*3/uL — ABNORMAL HIGH (ref 0.1–1.0)
Monocytes Relative: 10.9 % (ref 3.0–12.0)
Neutro Abs: 9.2 10*3/uL — ABNORMAL HIGH (ref 1.4–7.7)
Neutrophils Relative %: 68 % (ref 43.0–77.0)
Platelets: 287 10*3/uL (ref 150.0–400.0)
RBC: 4.46 Mil/uL (ref 4.22–5.81)
RDW: 13.6 % (ref 11.5–15.5)
WBC: 13.5 10*3/uL — ABNORMAL HIGH (ref 4.0–10.5)

## 2021-10-24 LAB — BRAIN NATRIURETIC PEPTIDE: Brain Natriuretic Peptide: 16 pg/mL (ref <100)

## 2021-10-24 NOTE — Progress Notes (Signed)
BNP is within normal limits.  CO2 scantly elevated- okay Glucose mild elevation was not fasting. CBC shows mild decrease in hemoglobin, elevated WBC, and neutrophils- he is currently on Augmentin.  Would advise recheck of CBC within one week.  Red Flags discussed. The patient was given clear instructions to go to ER or return to medical center if any red flags develop, symptoms do not improve, worsen or new problems develop. They verbalized understanding.

## 2021-10-24 NOTE — Telephone Encounter (Signed)
Most respiratory infections are viral.  No need for antibiotic.  Needs to run its course.  Should follow-up with urgent care if not getting better.

## 2021-10-24 NOTE — Progress Notes (Signed)
No infectious process on chest x ray. 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  CMP shows glucose elevated and also mild decrease in CO2.  Patient should schedule follow up with PCP within 2 weeks for follow up.

## 2021-11-14 ENCOUNTER — Other Ambulatory Visit: Payer: Self-pay | Admitting: Adult Health

## 2021-11-14 DIAGNOSIS — Z87898 Personal history of other specified conditions: Secondary | ICD-10-CM

## 2022-03-13 ENCOUNTER — Ambulatory Visit: Payer: 59 | Admitting: Emergency Medicine

## 2022-03-21 ENCOUNTER — Encounter: Payer: Self-pay | Admitting: Family Medicine

## 2022-03-21 ENCOUNTER — Ambulatory Visit: Payer: 59 | Admitting: Family Medicine

## 2022-03-21 VITALS — BP 124/84 | HR 60 | Temp 97.8°F | Ht 69.02 in | Wt 296.0 lb

## 2022-03-21 DIAGNOSIS — M545 Low back pain, unspecified: Secondary | ICD-10-CM | POA: Diagnosis not present

## 2022-03-21 MED ORDER — MELOXICAM 15 MG PO TABS: 15.0000 mg | ORAL_TABLET | Freq: Every day | ORAL | 0 refills | Status: DC

## 2022-03-21 MED ORDER — METHOCARBAMOL 500 MG PO TABS: 500.0000 mg | ORAL_TABLET | Freq: Three times a day (TID) | ORAL | 0 refills | Status: DC | PRN

## 2022-03-21 NOTE — Patient Instructions (Addendum)
Take the meloxicam daily as prescribed for the next 2 weeks.  ?You can also take Tylenol as needed.  ?You can take the muscle relaxant, methocarbamol, at bedtime if needed but be aware this is sedating.  ? ?Use heat and stretch 2-3 times per day.  ?You can also use a topical medication as discussed.  ? ?Follow up if worsening or if you are not significantly better in 2-4 weeks.  ?We can refer you to physical therapy.  ? ?Back Exercises ?These exercises help to make your trunk and back strong. They also help to keep the lower back flexible. Doing these exercises can help to prevent or lessen pain in your lower back. ?If you have back pain, try to do these exercises 2-3 times each day or as told by your doctor. ?As you get better, do the exercises once each day. Repeat the exercises more often as told by your doctor. ?To stop back pain from coming back, do the exercises once each day, or as told by your doctor. ?Do exercises exactly as told by your doctor. Stop right away if you feel sudden pain or your pain gets worse. ?Exercises ?Single knee to chest ?Do these steps 3-5 times in a row for each leg: ?Lie on your back on a firm bed or the floor with your legs stretched out. ?Bring one knee to your chest. ?Grab your knee or thigh with both hands and hold it in place. ?Pull on your knee until you feel a gentle stretch in your lower back or butt. ?Keep doing the stretch for 10-30 seconds. ?Slowly let go of your leg and straighten it. ?Pelvic tilt ?Do these steps 5-10 times in a row: ?Lie on your back on a firm bed or the floor with your legs stretched out. ?Bend your knees so they point up to the ceiling. Your feet should be flat on the floor. ?Tighten your lower belly (abdomen) muscles to press your lower back against the floor. This will make your tailbone point up to the ceiling instead of pointing down to your feet or the floor. ?Stay in this position for 5-10 seconds while you gently tighten your muscles and breathe  evenly. ?Cat-cow ?Do these steps until your lower back bends more easily: ?Get on your hands and knees on a firm bed or the floor. Keep your hands under your shoulders, and keep your knees under your hips. You may put padding under your knees. ?Let your head hang down toward your chest. Tighten (contract) the muscles in your belly. Point your tailbone toward the floor so your lower back becomes rounded like the back of a cat. ?Stay in this position for 5 seconds. ?Slowly lift your head. Let the muscles of your belly relax. Point your tailbone up toward the ceiling so your back forms a sagging arch like the back of a cow. ?Stay in this position for 5 seconds. ? ?Press-ups ?Do these steps 5-10 times in a row: ?Lie on your belly (face-down) on a firm bed or the floor. ?Place your hands near your head, about shoulder-width apart. ?While you keep your back relaxed and keep your hips on the floor, slowly straighten your arms to raise the top half of your body and lift your shoulders. Do not use your back muscles. You may change where you place your hands to make yourself more comfortable. ?Stay in this position for 5 seconds. Keep your back relaxed. ?Slowly return to lying flat on the floor. ? ?Bridges ?Do these  steps 10 times in a row: ?Lie on your back on a firm bed or the floor. ?Bend your knees so they point up to the ceiling. Your feet should be flat on the floor. Your arms should be flat at your sides, next to your body. ?Tighten your butt muscles and lift your butt off the floor until your waist is almost as high as your knees. If you do not feel the muscles working in your butt and the back of your thighs, slide your feet 1-2 inches (2.5-5 cm) farther away from your butt. ?Stay in this position for 3-5 seconds. ?Slowly lower your butt to the floor, and let your butt muscles relax. ?If this exercise is too easy, try doing it with your arms crossed over your chest. ?Belly crunches ?Do these steps 5-10 times in a  row: ?Lie on your back on a firm bed or the floor with your legs stretched out. ?Bend your knees so they point up to the ceiling. Your feet should be flat on the floor. ?Cross your arms over your chest. ?Tip your chin a little bit toward your chest, but do not bend your neck. ?Tighten your belly muscles and slowly raise your chest just enough to lift your shoulder blades a tiny bit off the floor. Avoid raising your body higher than that because it can put too much stress on your lower back. ?Slowly lower your chest and your head to the floor. ?Back lifts ?Do these steps 5-10 times in a row: ?Lie on your belly (face-down) with your arms at your sides, and rest your forehead on the floor. ?Tighten the muscles in your legs and your butt. ?Slowly lift your chest off the floor while you keep your hips on the floor. Keep the back of your head in line with the curve in your back. Look at the floor while you do this. ?Stay in this position for 3-5 seconds. ?Slowly lower your chest and your face to the floor. ?Contact a doctor if: ?Your back pain gets a lot worse when you do an exercise. ?Your back pain does not get better within 2 hours after you exercise. ?If you have any of these problems, stop doing the exercises. Do not do them again unless your doctor says it is okay. ?Get help right away if: ?You have sudden, very bad back pain. If this happens, stop doing the exercises. Do not do them again unless your doctor says it is okay. ?This information is not intended to replace advice given to you by your health care provider. Make sure you discuss any questions you have with your health care provider. ?Document Revised: 02/08/2021 Document Reviewed: 02/08/2021 ?Elsevier Patient Education ? [place]. ? ?

## 2022-03-21 NOTE — Progress Notes (Signed)
? ?Subjective:  ? ? ? Patient ID: Collin Murphy, male    DOB: 09-Jun-1966, 56 y.o.   MRN: [id number] ? ?Chief Complaint  ?Patient presents with  ? Back Pain  ?  When he called he was experiencing tightness in his back. Now, mornings he experiences tightness and pain. Right side feels a "catch" when he moves back, can also feel pain when he moves his neck.  ? ? ?HPI ?Patient is in today for a 3-4 week history of a dull, nonradiating, right low back pain. Pain is worse when he first gets out of the bed and after prolonged sitting.  States he is awakened at times when he turns over and has back pain.  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  Pain is alleviated by sitting and resting. ?No fever, chills, dizziness, chest pain, palpitations, shortness of breath, abdominal pain, nausea, vomiting or diarrhea.  No changes in bladder or bowel habits. ?Denies numbness, tingling or weakness. ? ?States he has not been able to play golf because he knows it will hurt.  He did play golf prior to onset of pain but no injury. ?He does not stretch and sits in his truck for long hours. ? ?States he went to the medical provider at his employer 2 weeks and he had a normal urine specimen.  ? ?States he took ibuprofen or 2 Aleve last week with some improvement. ?This week he has not taken anything.  ? ?Reports a 4-5 year history of intermittent low back pain. ? ?Health Maintenance Due  ?Topic Date Due  ? FOOT EXAM  Never done  ? OPHTHALMOLOGY EXAM  Never done  ? Hepatitis C Screening  Never done  ? Zoster Vaccines- Shingrix (1 of 2) Never done  ? TETANUS/TDAP  08/27/2019  ? URINE MICROALBUMIN  02/15/2022  ? ? ?Past Medical History:  ?Diagnosis Date  ? ALLERGIC RHINITIS 09/03/2007  ? Allergy   ? GERD 09/03/2007  ? HYPERCHOLESTEROLEMIA 08/26/2009  ? HYPERSOMNIA 08/26/2009  ? OBSTRUCTIVE SLEEP APNEA 09/21/2009  ? Prostatitis   ? multiple episodes  ? Sleep apnea   ? CPAP use sometimes  ? TINEA PEDIS  09/03/2007  ? ? ?Past Surgical History:  ?Procedure Laterality Date  ? TOE AMPUTATION    ? 3rd and 4th toes amputated after forklift injury  ? ? ?Family History  ?Problem Relation Age of Onset  ? Hypertension Mother   ? Heart disease Mother   ?     CAD  ? Diabetes Father   ? Gout Brother   ? COPD Brother   ? Cancer Neg Hx   ?     No history Colon, Prostate Breast CA  ? Colon cancer Neg Hx   ? Esophageal cancer Neg Hx   ? Stomach cancer Neg Hx   ? ? ?Social History  ? ?Socioeconomic History  ? Marital status: Married  ?  Spouse name: Not on file  ? Number of children: 2  ? Years of education: 90  ? Highest education level: Not on file  ?Occupational History  ? Occupation: Event organiser  ?  Employer: [employer]  ?Tobacco Use  ? Smoking status: Light Smoker  ?  Types: Cigars  ? Smokeless tobacco: Never  ? Tobacco comments:  ?  Very rarely. - 2x ayear   ?Vaping Use  ? Vaping Use: Never used  ?Substance and Sexual Activity  ? Alcohol use: Yes  ?  Comment: maybe monthly per pt.  ? Drug use: No  ? Sexual activity: Yes  ?  Partners: Female  ?Other Topics Concern  ? Not on file  ?Social History Narrative  ? HSG. [employer] sociology. Completed [employer]. Married-1990  ? Children : 1 boy '93, 1 girl '98. Work Engineer, mining.  ?   ?   ?   ? ?Social Determinants of Health  ? ?Financial Resource Strain: Not on file  ?Food Insecurity: Not on file  ?Transportation Needs: Not on file  ?Physical Activity: Not on file  ?Stress: Not on file  ?Social Connections: Not on file  ?Intimate Partner Violence: Not on file  ? ? ?Outpatient Medications Prior to Visit  ?Medication Sig Dispense Refill  ? albuterol (VENTOLIN HFA) 108 (90 Base) MCG/ACT inhaler Inhale 2 puffs into the lungs every 6 (six) hours as needed for wheezing or shortness of breath. 8 g 0  ? benzonatate (TESSALON) 100 MG capsule Take 1 capsule (100 mg total) by mouth 2 (two) times daily as needed for cough. 20 capsule 0  ? cetirizine  (ZYRTEC) 10 MG tablet Take 10 mg by mouth daily as needed for allergies.    ? fexofenadine (ALLEGRA) 180 MG tablet Take 180 mg by mouth daily as needed for allergies or rhinitis.    ? furosemide (LASIX) 20 MG tablet Take 1 tablet (20 mg total) by mouth daily as needed for fluid or edema. 30 tablet 0  ? metFORMIN (GLUCOPHAGE) 500 MG tablet Take 1 tablet (500 mg total) by mouth 2 (two) times daily with a meal. 180 tablet 3  ? promethazine-dextromethorphan (PROMETHAZINE-DM) 6.25-15 MG/5ML syrup Take 5 mLs by mouth 4 (four) times daily as needed for cough. 118 mL 0  ? rosuvastatin (CRESTOR) 20 MG tablet Take 1 tablet (20 mg total) by mouth daily. 90 tablet 3  ? silodosin (RAPAFLO) 8 MG CAPS capsule Take 8 mg by mouth daily.    ? tamsulosin (FLOMAX) 0.4 MG CAPS capsule Take 1 capsule (0.4 mg total) by mouth at bedtime. 30 capsule 3  ? Triamcinolone Acetonide (NASACORT AQ NA) Place 1 spray into the nose daily as needed (allergies).     ? amoxicillin-clavulanate (AUGMENTIN) 875-125 MG tablet Take 1 tablet by mouth 2 (two) times daily. 20 tablet 0  ? diclofenac (VOLTAREN) 50 MG EC tablet Take 50 mg by mouth 2 (two) times daily as needed for mild pain.     ? ?No facility-administered medications prior to visit.  ? ? ?No Known Allergies ? ?ROS ?Pertinent positives and negatives in the history of present illness. ? ?   ?Objective:  ?  ?Physical Exam ? ?BP 124/84 (BP Location: Left Arm, Patient Position: Sitting, Cuff Size: Large)   Pulse 60   Temp 97.8 ?F (36.6 ?C) (Temporal)   Ht 5' 9.02" (1.753 m)   Wt 296 lb (134.3 kg)   SpO2 96%   BMI 43.69 kg/m?  ?Wt Readings from Last 3 Encounters:  ?03/21/22 296 lb (134.3 kg)  ?10/23/21 298 lb 3.2 oz (135.3 kg)  ?10/04/21 (!) 301 lb (136.5 kg)  ? ?Alert and oriented and in no acute distress.  Respirations unlabored.  Cervical and thoracic back nontender and normal motion.  Right lumbar spine nontender.  Right paraspinal muscle tenderness with palpation as well as tenderness over  the sciatic notch on the right.  Pain in the right low back aggravated by flexion, extension, rotation and bending.  Bilateral lower extremity exam normal, symmetric range of  motion and strength.  Skin is warm and dry. ? ?   ?Assessment & Plan:  ? ?Problem List Items Addressed This Visit   ?None ?Visit Diagnoses   ? ? Acute right-sided low back pain without sciatica    -  Primary  ? Relevant Medications  ? meloxicam (MOBIC) 15 MG tablet  ? methocarbamol (ROBAXIN) 500 MG tablet  ? ?  ? ? ?I have discontinued Ephriam Jenkins. Vancleve's diclofenac and amoxicillin-clavulanate. I am also having him start on meloxicam and methocarbamol. Additionally, I am having him maintain his Triamcinolone Acetonide (NASACORT AQ NA), fexofenadine, cetirizine, furosemide, tamsulosin, rosuvastatin, metFORMIN, promethazine-dextromethorphan, silodosin, benzonatate, and albuterol. ? ?Meds ordered this encounter  ?Medications  ? meloxicam (MOBIC) 15 MG tablet  ?  Sig: Take 1 tablet (15 mg total) by mouth daily.  ?  Dispense:  30 tablet  ?  Refill:  0  ?  Order Specific Question:   Supervising Provider  ?  Answer:   Pricilla Holm A [[id number]]  ? methocarbamol (ROBAXIN) 500 MG tablet  ?  Sig: Take 1 tablet (500 mg total) by mouth every 8 (eight) hours as needed for muscle spasms.  ?  Dispense:  30 tablet  ?  Refill:  0  ?  Order Specific Question:   Supervising Provider  ?  Answer:   Pricilla Holm A [[id number]]  ? ?Discussed that his back pain is due to a musculoskeletal etiology.   In-depth counseling on back care including heat, stretching, topical analgesic, and NSAID for the next 1 to 2 weeks.  Meloxicam prescribed he will stop over-the-counter NSAIDs.  Robaxin prescribed to use as needed for severe pain when lying down or difficulty sleeping.  Discussed that the medication is sedating.  Handout regarding back exercises provided.  Discussed physical therapy and offered a referral.  He would like to hold off for now and will let me know if he  is getting worse or not improving over the next couple of weeks.  Imaging not warranted today. ? ?

## 2022-05-02 ENCOUNTER — Ambulatory Visit: Payer: 59 | Admitting: Emergency Medicine

## 2022-05-02 ENCOUNTER — Encounter: Payer: Self-pay | Admitting: Emergency Medicine

## 2022-05-02 VITALS — BP 130/82 | HR 64 | Temp 98.4°F | Ht 69.5 in | Wt 299.2 lb

## 2022-05-02 DIAGNOSIS — M549 Dorsalgia, unspecified: Secondary | ICD-10-CM | POA: Insufficient documentation

## 2022-05-02 DIAGNOSIS — E785 Hyperlipidemia, unspecified: Secondary | ICD-10-CM

## 2022-05-02 DIAGNOSIS — G4733 Obstructive sleep apnea (adult) (pediatric): Secondary | ICD-10-CM

## 2022-05-02 DIAGNOSIS — E1169 Type 2 diabetes mellitus with other specified complication: Secondary | ICD-10-CM

## 2022-05-02 DIAGNOSIS — R42 Dizziness and giddiness: Secondary | ICD-10-CM | POA: Insufficient documentation

## 2022-05-02 LAB — URINALYSIS, ROUTINE W REFLEX MICROSCOPIC
Bilirubin Urine: NEGATIVE
Ketones, ur: NEGATIVE
Nitrite: NEGATIVE
RBC / HPF: NONE SEEN (ref 0–?)
Specific Gravity, Urine: 1.02 (ref 1.000–1.030)
Total Protein, Urine: NEGATIVE
Urine Glucose: NEGATIVE
Urobilinogen, UA: 1 (ref 0.0–1.0)
pH: 6 (ref 5.0–8.0)

## 2022-05-02 LAB — CBC WITH DIFFERENTIAL/PLATELET
Basophils Absolute: 0 10*3/uL (ref 0.0–0.1)
Basophils Relative: 0.3 % (ref 0.0–3.0)
Eosinophils Absolute: 0.1 10*3/uL (ref 0.0–0.7)
Eosinophils Relative: 1.2 % (ref 0.0–5.0)
HCT: 38.4 % — ABNORMAL LOW (ref 39.0–52.0)
Hemoglobin: 12.8 g/dL — ABNORMAL LOW (ref 13.0–17.0)
Lymphocytes Relative: 40.7 % (ref 12.0–46.0)
Lymphs Abs: 3.2 10*3/uL (ref 0.7–4.0)
MCHC: 33.2 g/dL (ref 30.0–36.0)
MCV: 86.9 fl (ref 78.0–100.0)
Monocytes Absolute: 0.7 10*3/uL (ref 0.1–1.0)
Monocytes Relative: 9.5 % (ref 3.0–12.0)
Neutro Abs: 3.7 10*3/uL (ref 1.4–7.7)
Neutrophils Relative %: 48.3 % (ref 43.0–77.0)
Platelets: 216 10*3/uL (ref 150.0–400.0)
RBC: 4.42 Mil/uL (ref 4.22–5.81)
RDW: 13.6 % (ref 11.5–15.5)
WBC: 7.7 10*3/uL (ref 4.0–10.5)

## 2022-05-02 LAB — COMPREHENSIVE METABOLIC PANEL
ALT: 25 U/L (ref 0–53)
AST: 21 U/L (ref 0–37)
Albumin: 4.6 g/dL (ref 3.5–5.2)
Alkaline Phosphatase: 78 U/L (ref 39–117)
BUN: 14 mg/dL (ref 6–23)
CO2: 34 mEq/L — ABNORMAL HIGH (ref 19–32)
Calcium: 9.7 mg/dL (ref 8.4–10.5)
Chloride: 102 mEq/L (ref 96–112)
Creatinine, Ser: 1.08 mg/dL (ref 0.40–1.50)
GFR: 77.98 mL/min (ref >60.00)
Glucose, Bld: 88 mg/dL (ref 70–99)
Potassium: 4.2 mEq/L (ref 3.5–5.1)
Sodium: 141 mEq/L (ref 135–145)
Total Bilirubin: 0.5 mg/dL (ref 0.2–1.2)
Total Protein: 7.6 g/dL (ref 6.0–8.3)

## 2022-05-02 LAB — LIPID PANEL
Cholesterol: 173 mg/dL (ref 0–200)
HDL: 54.9 mg/dL (ref >39.00)
LDL Cholesterol: 101 mg/dL — ABNORMAL HIGH (ref 0–99)
NonHDL: 118.33
Total CHOL/HDL Ratio: 3
Triglycerides: 86 mg/dL (ref 0.0–149.0)
VLDL: 17.2 mg/dL (ref 0.0–40.0)

## 2022-05-02 LAB — HEMOGLOBIN A1C: Hgb A1c MFr Bld: 6.6 % — ABNORMAL HIGH (ref 4.6–6.5)

## 2022-05-02 MED ORDER — OZEMPIC (0.25 OR 0.5 MG/DOSE) 2 MG/3ML ~~LOC~~ SOPN: 0.5000 mg | PEN_INJECTOR | SUBCUTANEOUS | 5 refills | Status: DC

## 2022-05-02 NOTE — Assessment & Plan Note (Signed)
Chronic and affecting quality of life.  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  Needs orthopedic evaluation.  May benefit from physical therapy.  May need MRI of lumbar spine. Lumbar spine x-rays and CT scan of abdomen and pelvis done in 2022 reviewed with patient.  Unremarkable findings.

## 2022-05-02 NOTE — Assessment & Plan Note (Addendum)
Diet and nutrition discussed.  Cardiovascular risks associated with morbid obesity discussed.  May benefit from Ozempic use

## 2022-05-02 NOTE — Patient Instructions (Signed)
Acute Back Pain, Adult Acute back pain is sudden and usually short-lived. It is often caused by an injury to the muscles and tissues in the back. The injury may result from: A muscle, tendon, or ligament getting overstretched or torn. Ligaments are tissues that connect bones to each other. Lifting something improperly can cause a back strain. Wear and tear (degeneration) of the spinal disks. Spinal disks are circular tissue that provide cushioning between the bones of the spine (vertebrae). Twisting motions, such as while playing sports or doing yard work. A hit to the back. Arthritis. You may have a physical exam, lab tests, and imaging tests to find the cause of your pain. Acute back pain usually goes away with rest and home care. Follow these instructions at home: Managing pain, stiffness, and swelling Take over-the-counter and prescription medicines only as told by your health care provider. Treatment may include medicines for pain and inflammation that are taken by mouth or applied to the skin, or muscle relaxants. Your health care provider may recommend applying ice during the first 24-48 hours after your pain starts. To do this: Put ice in a plastic bag. Place a towel between your skin and the bag. Leave the ice on for 20 minutes, 2-3 times a day. Remove the ice if your skin turns bright red. This is very important. If you cannot feel pain, heat, or cold, you have a greater risk of damage to the area. If directed, apply heat to the affected area as often as told by your health care provider. Use the heat source that your health care provider recommends, such as a moist heat pack or a heating pad. Place a towel between your skin and the heat source. Leave the heat on for 20-30 minutes. Remove the heat if your skin turns bright red. This is especially important if you are unable to feel pain, heat, or cold. You have a greater risk of getting burned. Activity  Do not stay in bed. Staying in  bed for more than 1-2 days can delay your recovery. Sit up and stand up straight. Avoid leaning forward when you sit or hunching over when you stand. If you work at a desk, sit close to it so you do not need to lean over. Keep your chin tucked in. Keep your neck drawn back, and keep your elbows bent at a 90-degree angle (right angle). Sit high and close to the steering wheel when you drive. Add lower back (lumbar) support to your car seat, if needed. Take short walks on even surfaces as soon as you are able. Try to increase the length of time you walk each day. Do not sit, drive, or stand in one place for more than 30 minutes at a time. Sitting or standing for long periods of time can put stress on your back. Do not drive or use heavy machinery while taking prescription pain medicine. Use proper lifting techniques. When you bend and lift, use positions that put less stress on your back: Bend your knees. Keep the load close to your body. Avoid twisting. Exercise regularly as told by your health care provider. Exercising helps your back heal faster and helps prevent back injuries by keeping muscles strong and flexible. Work with a physical therapist to make a safe exercise program, as recommended by your health care provider. Do any exercises as told by your physical therapist. Lifestyle Maintain a healthy weight. Extra weight puts stress on your back and makes it difficult to have good   posture. Avoid activities or situations that make you feel anxious or stressed. Stress and anxiety increase muscle tension and can make back pain worse. Learn ways to manage anxiety and stress, such as through exercise. General instructions Sleep on a firm mattress in a comfortable position. Try lying on your side with your knees slightly bent. If you lie on your back, put a pillow under your knees. Keep your head and neck in a straight line with your spine (neutral position) when using electronic equipment like  smartphones or pads. To do this: Raise your smartphone or pad to look at it instead of bending your head or neck to look down. Put the smartphone or pad at the level of your face while looking at the screen. Follow your treatment plan as told by your health care provider. This may include: Cognitive or behavioral therapy. Acupuncture or massage therapy. Meditation or yoga. Contact a health care provider if: You have pain that is not relieved with rest or medicine. You have increasing pain going down into your legs or buttocks. Your pain does not improve after 2 weeks. You have pain at night. You lose weight without trying. You have a fever or chills. You develop nausea or vomiting. You develop abdominal pain. Get help right away if: You develop new bowel or bladder control problems. You have unusual weakness or numbness in your arms or legs. You feel faint. These symptoms may represent a serious problem that is an emergency. Do not wait to see if the symptoms will go away. Get medical help right away. Call your local emergency services (911 in the [place] Do not drive yourself to the hospital. Summary Acute back pain is sudden and usually short-lived. Use proper lifting techniques. When you bend and lift, use positions that put less stress on your back. Take over-the-counter and prescription medicines only as told by your health care provider, and apply heat or ice as told. This information is not intended to replace advice given to you by your health care provider. Make sure you discuss any questions you have with your health care provider. Document Revised: 02/17/2021 Document Reviewed: 02/17/2021 [employer] Patient Education  2023 [employer]  

## 2022-05-02 NOTE — Progress Notes (Signed)
Collin Murphy 56 y.o.   Chief Complaint  Patient presents with   Back Problem    Pain lower back,    Urinary Frequency   Dizziness    At night when he lays down    HISTORY OF PRESENT ILLNESS: This is a 56 y.o. male presenting today with several complaints: 1.  Chronic mid low back pain for years.  Was seen recently at the office and prescribed muscle relaxants and NSAIDs with only partial relief.  Concerned about his kidneys.  Will need orthopedic referral. 2.  Chronic dizziness along with feeling "loopy" during the day Snores, wakes up tired, has daytime somnolence.  Patient has history of obstructive sleep apnea but not using CPAP treatment as recommended. 3.  History of diabetes and dyslipidemia.  Has questions about Ozempic. No other complaints or medical concerns today.  HPI   Prior to Admission medications   Medication Sig Start Date End Date Taking? Authorizing Provider  albuterol (VENTOLIN HFA) 108 (90 Base) MCG/ACT inhaler Inhale 2 puffs into the lungs every 6 (six) hours as needed for wheezing or shortness of breath. 10/23/21  Yes Flinchum, Kelby Aline, FNP  benzonatate (TESSALON) 100 MG capsule Take 1 capsule (100 mg total) by mouth 2 (two) times daily as needed for cough. 10/23/21  Yes Flinchum, Kelby Aline, FNP  cetirizine (ZYRTEC) 10 MG tablet Take 10 mg by mouth daily as needed for allergies.   Yes [provider]  fexofenadine (ALLEGRA) 180 MG tablet Take 180 mg by mouth daily as needed for allergies or rhinitis.   Yes [provider]  furosemide (LASIX) 20 MG tablet Take 1 tablet (20 mg total) by mouth daily as needed for fluid or edema. 06/22/20  Yes Marrian Salvage, FNP  meloxicam (MOBIC) 15 MG tablet Take 1 tablet (15 mg total) by mouth daily. 03/21/22  Yes Henson, Vickie L, NP-C  metFORMIN (GLUCOPHAGE) 500 MG tablet Take 1 tablet (500 mg total) by mouth 2 (two) times daily with a meal. 10/06/21  Yes Roniya Tetro, Ines Bloomer, MD   methocarbamol (ROBAXIN) 500 MG tablet Take 1 tablet (500 mg total) by mouth every 8 (eight) hours as needed for muscle spasms. 03/21/22  Yes Henson, Vickie L, NP-C  promethazine-dextromethorphan (PROMETHAZINE-DM) 6.25-15 MG/5ML syrup Take 5 mLs by mouth 4 (four) times daily as needed for cough. 10/18/21  Yes Hagler, Aaron Edelman, MD  rosuvastatin (CRESTOR) 20 MG tablet Take 1 tablet (20 mg total) by mouth daily. 10/04/21  Yes Antuan Limes, Ines Bloomer, MD  silodosin (RAPAFLO) 8 MG CAPS capsule Take 8 mg by mouth daily. 05/18/21  Yes [provider]  tamsulosin (FLOMAX) 0.4 MG CAPS capsule Take 1 capsule (0.4 mg total) by mouth at bedtime. 10/04/21  Yes Makhiya Coburn, Ines Bloomer, MD  Triamcinolone Acetonide (NASACORT AQ NA) Place 1 spray into the nose daily as needed (allergies).    Yes [provider]    No Known Allergies  Patient Active Problem List   Diagnosis Date Noted   Acute cough 10/23/2021   Acute non-recurrent sinusitis 10/23/2021   History of wheezing 10/23/2021   Benign prostatic hyperplasia with lower urinary tract symptoms 10/04/2021   Diabetes mellitus without complication ([place]) 93/26/7124   Bilateral lower extremity edema 06/28/2016   Obesity, Class III, BMI 40-49.9 (morbid obesity) ([place]) 03/05/2013   Obstructive sleep apnea 09/21/2009   HYPERCHOLESTEROLEMIA 08/26/2009   Gastroesophageal reflux disease 09/03/2007    Past Medical History:  Diagnosis Date   ALLERGIC RHINITIS 09/03/2007   Allergy  GERD 09/03/2007   HYPERCHOLESTEROLEMIA 08/26/2009   HYPERSOMNIA 08/26/2009   OBSTRUCTIVE SLEEP APNEA 09/21/2009   Prostatitis    multiple episodes   Sleep apnea    CPAP use sometimes   TINEA PEDIS 09/03/2007    Past Surgical History:  Procedure Laterality Date   TOE AMPUTATION     3rd and 4th toes amputated after forklift injury    Social History   Socioeconomic History   Marital status: Married    Spouse name: Not on file   Number of children: 2   Years of  education: 16   Highest education level: Not on file  Occupational History   Occupation: Mental health and Information systems manager    Employer: [employer]  Tobacco Use   Smoking status: Light Smoker    Types: Cigars   Smokeless tobacco: Never   Tobacco comments:    Very rarely. - 2x ayear   Vaping Use   Vaping Use: Never used  Substance and Sexual Activity   Alcohol use: Yes    Comment: maybe monthly per pt.   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 Completed [employer]. Married-1990   Children : 1 boy '93, 1 girl '98. Work Engineer, mining.            Social Determinants of Health   Financial Resource Strain: Not on file  Food Insecurity: Not on file  Transportation Needs: Not on file  Physical Activity: Not on file  Stress: Not on file  Social Connections: Not on file  Intimate Partner Violence: Not on file    Family History  Problem Relation Age of Onset   Hypertension Mother    Heart disease Mother        CAD   Diabetes Father    Gout Brother    COPD Brother    Cancer Neg Hx        No history Colon, Prostate Breast CA   Colon cancer Neg Hx    Esophageal cancer Neg Hx    Stomach cancer Neg Hx      Review of Systems  Constitutional: Negative.  Negative for chills and weight loss.  HENT: Negative.  Negative for congestion and sore throat.   Respiratory: Negative.  Negative for cough and shortness of breath.   Cardiovascular: Negative.  Negative for chest pain and palpitations.  Gastrointestinal:  Negative for abdominal pain, diarrhea, nausea and vomiting.  Genitourinary: Negative.   Musculoskeletal:  Positive for back pain.  Skin: Negative.  Negative for rash.  Neurological:  Positive for dizziness. Negative for headaches.  All other systems reviewed and are negative.  Today's Vitals   05/02/22 0924  BP: 130/82  Pulse: 64  Temp:  98.4 F (36.9 C)  TempSrc: Oral  SpO2: 96%  Weight: 299 lb 4 oz (135.7 kg)  Height: 5' 9.5" (1.765 m)   Body mass index is 43.56 kg/m.  Physical Exam Vitals reviewed.  Constitutional:      Appearance: Normal appearance.  HENT:     Head: Normocephalic.     Mouth/Throat:     Mouth: Mucous membranes are moist.     Pharynx: Oropharynx is clear.  Eyes:     Extraocular Movements: Extraocular movements intact.     Conjunctiva/sclera: Conjunctivae normal.     Pupils: Pupils are equal, round, and reactive to light.  Cardiovascular:  Rate and Rhythm: Normal rate and regular rhythm.     Pulses: Normal pulses.     Heart sounds: Normal heart sounds.  Pulmonary:     Effort: Pulmonary effort is normal.     Breath sounds: Normal breath sounds.  Musculoskeletal:        General: Normal range of motion.     Cervical back: No tenderness.     Right lower leg: No edema.     Left lower leg: No edema.  Lymphadenopathy:     Cervical: No cervical adenopathy.  Skin:    General: Skin is warm and dry.     Capillary Refill: Capillary refill takes less than 2 seconds.  Neurological:     General: No focal deficit present.     Mental Status: He is alert and oriented to person, place, and time.     ASSESSMENT & PLAN: A total of 55 minutes was spent with the patient and counseling/coordination of care regarding preparing for this visit, review of most recent office visit notes, review of most recent blood work results, review of several imaging reports done last year, review of all medications and changes made including addition of Ozempic, review of multiple chronic medical problems and their management, cardiovascular risks associated with diabetes and dyslipidemia, diagnosis of sleep apnea and treatment, prognosis, documentation, need for follow-up.  Problem List Items Addressed This Visit       Respiratory   Obstructive sleep apnea    Currently active and affecting quality of life with  multiple symptoms. States he has all the equipment necessary for CPAP treatment. Advised to start using it every day and monitor symptoms.          Endocrine   Dyslipidemia associated with type 2 diabetes mellitus ([place]) - Primary    Blood work done today.  Diet and nutrition discussed. Advised to decrease amount of daily carbohydrate intake and reduce daily calories as well.  Continue metformin 500 mg twice a day and start Ozempic 0.5 mg weekly. Cardiovascular risks associated with diabetes and dyslipidemia discussed.       Relevant Medications   Semaglutide,0.25 or 0.'5MG'$ /DOS, (OZEMPIC, 0.25 OR 0.5 MG/DOSE,) 2 MG/3ML SOPN   Other Relevant Orders   CBC with Differential/Platelet   Comprehensive metabolic panel   Hemoglobin A1c   Lipid panel     Other   Morbid obesity (HCC)    Diet and nutrition discussed.  Cardiovascular risks associated with morbid obesity discussed.  May benefit from Ozempic use       Relevant Medications   Semaglutide,0.25 or 0.'5MG'$ /DOS, (OZEMPIC, 0.25 OR 0.5 MG/DOSE,) 2 MG/3ML SOPN   Dizziness   Musculoskeletal back pain    Chronic and affecting quality of life.  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  Needs orthopedic evaluation.  May benefit from physical therapy.  May need MRI of lumbar spine. Lumbar spine x-rays and CT scan of abdomen and pelvis done in 2022 reviewed with patient.  Unremarkable findings.       Relevant Orders   Ambulatory referral to Sports Medicine   CBC with Differential/Platelet   Urinalysis   Patient Instructions  Acute Back Pain, Adult Acute back pain is sudden and usually short-lived. It is often caused by an injury to the muscles and tissues in the back. The injury may result from: A muscle, tendon, or ligament getting overstretched or torn. Ligaments are tissues that connect bones to each other. Lifting something improperly can cause a back  strain. Wear and tear (degeneration) of the spinal  disks. Spinal disks are circular tissue that provide cushioning between the bones of the spine (vertebrae). Twisting motions, such as while playing sports or doing yard work. A hit to the back. Arthritis. You may have a physical exam, lab tests, and imaging tests to find the cause of your pain. Acute back pain usually goes away with rest and home care. Follow these instructions at home: Managing pain, stiffness, and swelling Take over-the-counter and prescription medicines only as told by your health care provider. Treatment may include medicines for pain and inflammation that are taken by mouth or applied to the skin, or muscle relaxants. Your health care provider may recommend applying ice during the first 24-48 hours after your pain starts. To do this: Put ice in a plastic bag. Place a towel between your skin and the bag. Leave the ice on for 20 minutes, 2-3 times a day. Remove the ice if your skin turns bright red. This is very important. If you cannot feel pain, heat, or cold, you have a greater risk of damage to the area. If directed, apply heat to the affected area as often as told by your health care provider. Use the heat source that your health care provider recommends, such as a moist heat pack or a heating pad. Place a towel between your skin and the heat source. Leave the heat on for 20-30 minutes. Remove the heat if your skin turns bright red. This is especially important if you are unable to feel pain, heat, or cold. You have a greater risk of getting burned. Activity  Do not stay in bed. Staying in bed for more than 1-2 days can delay your recovery. Sit up and stand up straight. Avoid leaning forward when you sit or hunching over when you stand. If you work at a desk, sit close to it so you do not need to lean over. Keep your chin tucked in. Keep your neck drawn back, and keep your elbows bent at a 90-degree angle (right angle). Sit high and close to the steering wheel when you  drive. Add lower back (lumbar) support to your car seat, if needed. Take short walks on even surfaces as soon as you are able. Try to increase the length of time you walk each day. Do not sit, drive, or stand in one place for more than 30 minutes at a time. Sitting or standing for long periods of time can put stress on your back. Do not drive or use heavy machinery while taking prescription pain medicine. Use proper lifting techniques. When you bend and lift, use positions that put less stress on your back: Hazel your knees. Keep the load close to your body. Avoid twisting. Exercise regularly as told by your health care provider. Exercising helps your back heal faster and helps prevent back injuries by keeping muscles strong and flexible. Work with a physical therapist to make a safe exercise program, as recommended by your health care provider. Do any exercises as told by your physical therapist. Lifestyle Maintain a healthy weight. Extra weight puts stress on your back and makes it difficult to have good posture. Avoid activities or situations that make you feel anxious or stressed. Stress and anxiety increase muscle tension and can make back pain worse. Learn ways to manage anxiety and stress, such as through exercise. General instructions Sleep on a firm mattress in a comfortable position. Try lying on your side with your knees slightly bent.  If you lie on your back, put a pillow under your knees. Keep your head and neck in a straight line with your spine (neutral position) when using electronic equipment like smartphones or pads. To do this: Raise your smartphone or pad to look at it instead of bending your head or neck to look down. Put the smartphone or pad at the level of your face while looking at the screen. Follow your treatment plan as told by your health care provider. This may include: Cognitive or behavioral therapy. Acupuncture or massage therapy. Meditation or yoga. Contact a  health care provider if: You have pain that is not relieved with rest or medicine. You have increasing pain going down into your legs or buttocks. Your pain does not improve after 2 weeks. You have pain at night. You lose weight without trying. You have a fever or chills. You develop nausea or vomiting. You develop abdominal pain. Get help right away if: You develop new bowel or bladder control problems. You have unusual weakness or numbness in your arms or legs. You feel faint. These symptoms may represent a serious problem that is an emergency. Do not wait to see if the symptoms will go away. Get medical help right away. Call your local emergency services (911 in the [place] Do not drive yourself to the hospital. Summary Acute back pain is sudden and usually short-lived. Use proper lifting techniques. When you bend and lift, use positions that put less stress on your back. Take over-the-counter and prescription medicines only as told by your health care provider, and apply heat or ice as told. This information is not intended to replace advice given to you by your health care provider. Make sure you discuss any questions you have with your health care provider. Document Revised: 02/17/2021 Document Reviewed: 02/17/2021 Elsevier Patient Education  [place], MD [facility]

## 2022-05-02 NOTE — Assessment & Plan Note (Signed)
Blood work done today.  Diet and nutrition discussed. Advised to decrease amount of daily carbohydrate intake and reduce daily calories as well.  Continue metformin 500 mg twice a day and start Ozempic 0.5 mg weekly. Cardiovascular risks associated with diabetes and dyslipidemia discussed.

## 2022-05-02 NOTE — Assessment & Plan Note (Signed)
Currently active and affecting quality of life with multiple symptoms. States he has all the equipment necessary for CPAP treatment. Advised to start using it every day and monitor symptoms.

## 2022-05-07 ENCOUNTER — Other Ambulatory Visit: Payer: Self-pay | Admitting: Emergency Medicine

## 2022-05-07 DIAGNOSIS — N401 Enlarged prostate with lower urinary tract symptoms: Secondary | ICD-10-CM

## 2022-05-15 NOTE — Progress Notes (Unsigned)
Collin Murphy Collin Murphy Phone: [phone]   Assessment and Plan:     There are no diagnoses linked to this encounter.  ***   Pertinent previous records reviewed include ***   Follow Up: ***     Subjective:   I, Carlous Olivares, am serving as a Education administrator for Doctor Glennon Mac  Chief Complaint: ow back pain   HPI:   05/16/2022 Patient is a 56 year old male complaining of back pain. Patient states 3-4 week history of a dull, nonradiating, right low back pain. Pain is worse when he first gets out of the bed and after prolonged sitting.  States he is awakened at times when he turns over and has back pain.  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  Pain is alleviated by sitting and resting. No fever, chills, dizziness, chest pain, palpitations, shortness of breath, abdominal pain, nausea, vomiting or diarrhea.  No changes in bladder or bowel habits. Denies numbness, tingling or weakness.   States he has not been able to play golf because he knows it will hurt.  He did play golf prior to onset of pain but no injury. 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   States he went to the medical provider at his employer 2 weeks and he had a normal urine specimen.    States he took ibuprofen or 2 Aleve last week with some improvement. This week he has not taken anything.    Reports a 4-5 year history of intermittent low back pain.  Relevant Historical Information: ***  Additional pertinent review of systems negative.   Current Outpatient Medications:    albuterol (VENTOLIN HFA) 108 (90 Base) MCG/ACT inhaler, Inhale 2 puffs into the lungs every 6 (six) hours as needed for wheezing or shortness of breath., Disp: 8 g, Rfl: 0   benzonatate (TESSALON) 100 MG capsule, Take 1 capsule (100 mg total) by mouth 2 (two) times daily as needed for cough., Disp: 20  capsule, Rfl: 0   cetirizine (ZYRTEC) 10 MG tablet, Take 10 mg by mouth daily as needed for allergies., Disp: , Rfl:    fexofenadine (ALLEGRA) 180 MG tablet, Take 180 mg by mouth daily as needed for allergies or rhinitis., Disp: , Rfl:    furosemide (LASIX) 20 MG tablet, Take 1 tablet (20 mg total) by mouth daily as needed for fluid or edema., Disp: 30 tablet, Rfl: 0   meloxicam (MOBIC) 15 MG tablet, Take 1 tablet (15 mg total) by mouth daily., Disp: 30 tablet, Rfl: 0   metFORMIN (GLUCOPHAGE) 500 MG tablet, Take 1 tablet (500 mg total) by mouth 2 (two) times daily with a meal., Disp: 180 tablet, Rfl: 3   methocarbamol (ROBAXIN) 500 MG tablet, Take 1 tablet (500 mg total) by mouth every 8 (eight) hours as needed for muscle spasms., Disp: 30 tablet, Rfl: 0   promethazine-dextromethorphan (PROMETHAZINE-DM) 6.25-15 MG/5ML syrup, Take 5 mLs by mouth 4 (four) times daily as needed for cough., Disp: 118 mL, Rfl: 0   rosuvastatin (CRESTOR) 20 MG tablet, Take 1 tablet (20 mg total) by mouth daily., Disp: 90 tablet, Rfl: 3   Semaglutide,0.25 or 0.'5MG'$ /DOS, (OZEMPIC, 0.25 OR 0.5 MG/DOSE,) 2 MG/3ML SOPN, Inject 0.5 mg into the skin once a week., Disp: 3 mL, Rfl: 5   silodosin (RAPAFLO) 8 MG CAPS capsule, Take 8 mg by mouth  daily., Disp: , Rfl:    tamsulosin (FLOMAX) 0.4 MG CAPS capsule, Take 1 capsule (0.4 mg total) by mouth at bedtime., Disp: 30 capsule, Rfl: 3   Triamcinolone Acetonide (NASACORT AQ NA), Place 1 spray into the nose daily as needed (allergies). , Disp: , Rfl:    Objective:     There were no vitals filed for this visit.    There is no height or weight on file to calculate BMI.    Physical Exam:    ***   Electronically signed by:  Collin Mccreedy D.[facility] 8:02 AM 05/15/22

## 2022-05-16 ENCOUNTER — Ambulatory Visit: Payer: 59 | Admitting: Sports Medicine

## 2022-05-16 VITALS — BP 118/80 | HR 61 | Ht 69.0 in | Wt 302.0 lb

## 2022-05-16 DIAGNOSIS — G8929 Other chronic pain: Secondary | ICD-10-CM

## 2022-05-16 DIAGNOSIS — M545 Low back pain, unspecified: Secondary | ICD-10-CM | POA: Diagnosis not present

## 2022-05-16 NOTE — Patient Instructions (Addendum)
Good to see you  Tylenol [id number] mg 2-3 times a day for pain relief  Stop taking meloxicam my use remainder as needed for breakthrough pain  HEP  low back pain  Pt referral  Recommend getting cushioned inserts Fleet feet can be helpful  4 week follow up

## 2022-06-06 ENCOUNTER — Ambulatory Visit: Payer: 59 | Attending: Sports Medicine

## 2022-06-06 ENCOUNTER — Other Ambulatory Visit: Payer: Self-pay

## 2022-06-06 DIAGNOSIS — M6281 Muscle weakness (generalized): Secondary | ICD-10-CM | POA: Insufficient documentation

## 2022-06-06 DIAGNOSIS — G8929 Other chronic pain: Secondary | ICD-10-CM | POA: Insufficient documentation

## 2022-06-06 DIAGNOSIS — M545 Low back pain, unspecified: Secondary | ICD-10-CM | POA: Diagnosis not present

## 2022-06-06 DIAGNOSIS — M5459 Other low back pain: Secondary | ICD-10-CM | POA: Insufficient documentation

## 2022-06-06 NOTE — Therapy (Signed)
OUTPATIENT PHYSICAL THERAPY THORACOLUMBAR EVALUATION   Patient Name: Collin Murphy MRN: [id number] DOB:1966-07-09, 56 y.o., male Today's Date: 06/07/2022   PT End of Session - 06/07/22 0611     Visit Number 1    Number of Visits 7    Date for PT Re-Evaluation 07/27/22    Authorization Type [employer] OTHER    PT Start Time 0720    PT Stop Time 0804    PT Time Calculation (min) 44 min    Activity Tolerance Patient tolerated treatment well    Behavior During Therapy [facility] for tasks assessed/performed             Past Medical History:  Diagnosis Date   ALLERGIC RHINITIS 09/03/2007   Allergy    GERD 09/03/2007   HYPERCHOLESTEROLEMIA 08/26/2009   HYPERSOMNIA 08/26/2009   OBSTRUCTIVE SLEEP APNEA 09/21/2009   Prostatitis    multiple episodes   Sleep apnea    CPAP use sometimes   TINEA PEDIS 09/03/2007   Past Surgical History:  Procedure Laterality Date   TOE AMPUTATION     3rd and 4th toes amputated after forklift injury   Patient Active Problem List   Diagnosis Date Noted   Dizziness 05/02/2022   Musculoskeletal back pain 05/02/2022   Dyslipidemia associated with type 2 diabetes mellitus ([place]) 05/02/2022   History of wheezing 10/23/2021   Benign prostatic hyperplasia with lower urinary tract symptoms 10/04/2021   Diabetes mellitus without complication ([place]) 30/06/6225   Bilateral lower extremity edema 06/28/2016   Morbid obesity ([place]) 03/05/2013   Obstructive sleep apnea 09/21/2009   HYPERCHOLESTEROLEMIA 08/26/2009   Gastroesophageal reflux disease 09/03/2007    PCP: Horald Pollen, MD  REFERRING PROVIDER: Glennon Mac, DO  REFERRING DIAG: [phone] (ICD-10-CM) - Chronic bilateral low back pain without sciatica  Rationale for Evaluation and Treatment Rehabilitation  THERAPY DIAG:  Other low back pain  Muscle weakness (generalized)  ONSET DATE: 3 months ago  SUBJECTIVE:                                                                                                                                                                                            SUBJECTIVE STATEMENT: Pt reports experiencing more low back following a golf trip in March. He also reports pain with prolonged sitting associated with his job. He notes receiving stretches from Dr. Glennon Mac and he reports some improvement.  PERTINENT HISTORY:  Sleep apnea  PAIN:  Are you having pain? Yes: NPRS scale: 4/10 Pain location: R mid to low back and side Pain description: ache, spasm Aggravating factors: Sitting, sleeping (mattress?), driving Relieving factors: Movement, tylenol  1-4/10 pain range   PRECAUTIONS: None  WEIGHT BEARING RESTRICTIONS No  FALLS:  Has patient fallen in last 6 months? No  LIVING ENVIRONMENT: Lives with: lives with their family Lives in: House/apartment No issue with accessing home  OCCUPATION: Drives a truck-garbage collection  PLOF: Independent  PATIENT GOALS: Less pain, more active, weight loss    OBJECTIVE:   DIAGNOSTIC FINDINGS:  Lumbar Xray 02/14/21 COMPARISON:  Apr 30, 2020.    FINDINGS: No fracture or spondylolisthesis is noted. Disc spaces are well-maintained.   IMPRESSION: Negative.  PATIENT SURVEYS:  FOTO TBA  SCREENING FOR RED FLAGS: Bowel or bladder incontinence: No Spinal tumors: No Cauda equina syndrome: No Compression fracture: No   COGNITION:  Overall cognitive status: Within functional limits for tasks assessed     SENSATION: WFL  MUSCLE LENGTH: Hamstrings: Right WNLs deg; Left Tight deg Marcello Moores test: Right WNLs deg; Left Tight deg  POSTURE: increased lumbar lordosis  PALPATION: TTP to the R mid/lower back   LUMBAR ROM:   Active  A/PROM  eval  Flexion WNLs tightness low back  Extension WNLs pressure low back  Right lateral flexion WNLs tightness L  Left lateral flexion WNLs tightness R  Right rotation WNLs  Left rotation WNLs Tightness R mid back   (Blank rows =  not tested)  LOWER EXTREMITY ROM:     Grossly WNLs  LOWER EXTREMITY MMT:     Weak core with pt able to maintain a plank for 10 sec MMT Right eval Left eval  Hip flexion 5 5  Hip extension 5 5  Hip abduction 5 5  Hip adduction 5 5  Hip internal rotation 5 5  Hip external rotation 5 5  Knee flexion 5 5  Knee extension 5 5  Ankle dorsiflexion    Ankle plantarflexion    Ankle inversion    Ankle eversion     (Blank rows = not tested)  LUMBAR SPECIAL TESTS:  Straight leg raise test: Negative and Slump test: Negative  GAIT: Distance walked: 200 ft Assistive device utilized: None Level of assistance: Complete Independence Comments: WNLs  TODAY'S TREATMENT  L stretch forward and lateral x2 each 20" Standing back ext x10  PATIENT EDUCATION:  Education details: Eval findings, POC, HEP, sleeping position and support for comfort, periodic movement from prolonged periods of sitting Person educated: Patient Education method: Explanation, Demonstration, Tactile cues, Verbal cues, and Handouts Education comprehension: verbalized understanding, returned demonstration, verbal cues required, and tactile cues required   HOME EXERCISE PROGRAM: Access Code: [email or web address] URL: [email or web address] Date: 06/07/2022 Prepared by: Gar Ponto  Exercises - Standing 'L' Stretch at Counter  - 3-6 x daily - 7 x weekly - 1 sets - 3 reps - 20 hold - Standing Back Extension  - 3-6 x daily - 7 x weekly - 1 sets - 10 reps - 3 hold  ASSESSMENT:  CLINICAL IMPRESSION: Patient is a 56 y.o. M who was seen today for physical therapy evaluation and treatment for chronic low back pain s sciatica. Pt presents with possible R mid/lower back muscle strain related to a golf trip and low back pain associated with prolonged sitting and postural dysfunction of increased lordosis.  OBJECTIVE IMPAIRMENTS decreased activity tolerance, decreased strength, increased muscle spasms, postural dysfunction,  obesity, and pain.   ACTIVITY LIMITATIONS sitting and sleeping  PARTICIPATION LIMITATIONS: driving, occupation, and recreation-golf  PERSONAL FACTORS Social background and 1 comorbidity: obesity  are also affecting patient's functional outcome.   REHAB POTENTIAL: Excellent  CLINICAL  DECISION MAKING: Stable/uncomplicated  EVALUATION COMPLEXITY: Low   GOALS:   SHORT TERM GOALS: = LTGs   LONG TERM GOALS: Target date: 07/27/22  Pt will report a decreased in low back pain to 2/10 or less with daily activities Baseline: 4/10 Goal status: INITIAL  2.  Pt will demonstrate improved core strength maintaining a plank for 60 sec  Baseline: 10 sec Goal status: INITIAL  3.  Pt will be able to verbalize measures to help with the management of low back pain Baseline:  Goal status: INITIAL  4.  Pt will be Ind in a HEP to maintain achieved LOF Baseline: started on eval Goal status: INITIAL  5.  Pt's FOTO score will improve by the predicted value as indication of improved function Baseline: TBA Goal status: INITIAL   PLAN: PT FREQUENCY: 1x/week  PT DURATION: 6 weeks  PLANNED INTERVENTIONS: Therapeutic exercises, Therapeutic activity, Patient/Family education, Joint mobilization, Dry Needling, Electrical stimulation, Spinal manipulation, Spinal mobilization, Cryotherapy, Moist heat, Taping, Traction, Ionotophoresis '4mg'$ /ml Dexamethasone, Manual therapy, and Re-evaluation.  PLAN FOR NEXT SESSION: Assess FOTO. Assess response to HEP and management of prolonged static sitting; modalities, manual therapy, and TPDN; progress there ex as inidcated   Gar Ponto, PT 06/07/2022, 5:13 PM

## 2022-07-04 ENCOUNTER — Ambulatory Visit: Payer: 59

## 2022-07-11 ENCOUNTER — Ambulatory Visit: Payer: 59

## 2022-07-18 ENCOUNTER — Ambulatory Visit: Payer: 59

## 2022-12-04 ENCOUNTER — Other Ambulatory Visit: Payer: Self-pay | Admitting: Emergency Medicine

## 2022-12-04 DIAGNOSIS — N401 Enlarged prostate with lower urinary tract symptoms: Secondary | ICD-10-CM

## 2022-12-04 DIAGNOSIS — E1169 Type 2 diabetes mellitus with other specified complication: Secondary | ICD-10-CM

## 2023-01-10 ENCOUNTER — Telehealth: Payer: 59 | Admitting: Nurse Practitioner

## 2023-01-10 ENCOUNTER — Encounter: Payer: Self-pay | Admitting: Emergency Medicine

## 2023-01-10 ENCOUNTER — Encounter: Payer: Self-pay | Admitting: Nurse Practitioner

## 2023-01-10 VITALS — Ht 69.0 in

## 2023-01-10 DIAGNOSIS — J329 Chronic sinusitis, unspecified: Secondary | ICD-10-CM | POA: Diagnosis not present

## 2023-01-10 MED ORDER — PROMETHAZINE-DM 6.25-15 MG/5ML PO SYRP: 5.0000 mL | ORAL_SOLUTION | Freq: Every evening | ORAL | 0 refills | Status: DC | PRN

## 2023-01-10 NOTE — Progress Notes (Signed)
   Established Patient Office Visit  An audio/visual tele-health visit was completed today for this patient. I connected with  Collin Murphy on 01/10/23 utilizing audio/visual technology and verified that I am speaking with the correct person using two identifiers. The patient was located at their home, and I was located at the office of [place] at [facility] during the encounter. I discussed the limitations of evaluation and management by telemedicine. The patient expressed understanding and agreed to proceed.    Subjective   Patient ID: Collin Murphy, male    DOB: 14-Jul-1966  Age: 57 y.o. MRN: [id number]  Chief Complaint  Patient presents with   Cough    Tested Tuesday and Wednesday both negative  Nasal congestion is clear ( light green in color)  Tired  OTC med mucinex was not helpful     Patient reports symptom onset 5 days ago, experiencing nasal congestion mild cough with scant sputum production.  Has had a headache.  Also has noticed reduced taste.  Tested for COVID on day 3 and 4 of illness onset both times were negative.  Nasal congestion worse at night which is making it hard for him to sleep.  Is taking Mucinex sinus and azithromycin that he had on hand.  Symptoms seem to be getting a bit better, but are lingering so he wanted to be evaluated today.    Review of Systems  Constitutional:  Negative for fever.  HENT:  Positive for congestion.        (+) sneezing  Respiratory:  Positive for cough and sputum production. Negative for shortness of breath and wheezing.   Cardiovascular:  Negative for chest pain.  Gastrointestinal:  Negative for diarrhea.  Neurological:  Positive for headaches.      Objective:     Ht '5\' 9"'$  (1.753 m)   BMI 44.60 kg/m  BP Readings from Last 3 Encounters:  05/16/22 118/80  05/02/22 130/82  03/21/22 124/84   Wt Readings from Last 3 Encounters:  05/16/22 (!) 302 lb (137 kg)  05/02/22 299 lb 4 oz (135.7 kg)  03/21/22 296  lb (134.3 kg)      Physical Exam Comprehensive physical exam not completed today as office visit was conducted remotely.  Patient appears generally well, no signs of acute respiratory distress..  Patient was alert and oriented, and appeared to have appropriate judgment.   No results found for any visits on 01/10/23.    The 10-year ASCVD risk score (Arnett DK, et al., 2019) is: 16%    Assessment & Plan:   Problem List Items Addressed This Visit       Respiratory   Sinusitis - Primary    Acute, recommended rest as well as continue course of at home azithromycin, as needed Mucinex sinus during the day, and promethazine-dextromethorphan at night.  Patient told to call office if symptoms persist into next week at which point may consider changing to a different antibiotic.  Patient reported understanding.      Relevant Medications   promethazine-dextromethorphan (PROMETHAZINE-DM) 6.25-15 MG/5ML syrup    Return if symptoms worsen or fail to improve.    Ailene Ards, NP

## 2023-01-10 NOTE — Assessment & Plan Note (Signed)
Acute, recommended rest as well as continue course of at home azithromycin, as needed Mucinex sinus during the day, and promethazine-dextromethorphan at night.  Patient told to call office if symptoms persist into next week at which point may consider changing to a different antibiotic.  Patient reported understanding.

## 2023-10-09 ENCOUNTER — Ambulatory Visit: Payer: 59 | Admitting: Emergency Medicine

## 2023-10-09 ENCOUNTER — Encounter: Payer: Self-pay | Admitting: Emergency Medicine

## 2023-10-09 VITALS — BP 120/78 | HR 61 | Temp 98.3°F | Ht 69.0 in | Wt 300.5 lb

## 2023-10-09 DIAGNOSIS — Z7985 Long-term (current) use of injectable non-insulin antidiabetic drugs: Secondary | ICD-10-CM

## 2023-10-09 DIAGNOSIS — E785 Hyperlipidemia, unspecified: Secondary | ICD-10-CM | POA: Diagnosis not present

## 2023-10-09 DIAGNOSIS — E1169 Type 2 diabetes mellitus with other specified complication: Secondary | ICD-10-CM | POA: Diagnosis not present

## 2023-10-09 DIAGNOSIS — N401 Enlarged prostate with lower urinary tract symptoms: Secondary | ICD-10-CM | POA: Diagnosis not present

## 2023-10-09 DIAGNOSIS — Z9189 Other specified personal risk factors, not elsewhere classified: Secondary | ICD-10-CM

## 2023-10-09 DIAGNOSIS — G4733 Obstructive sleep apnea (adult) (pediatric): Secondary | ICD-10-CM

## 2023-10-09 LAB — POCT GLYCOSYLATED HEMOGLOBIN (HGB A1C): Hemoglobin A1C: 6 % — AB (ref 4.0–5.6)

## 2023-10-09 LAB — CBC WITH DIFFERENTIAL/PLATELET
Basophils Absolute: 0 K/uL (ref 0.0–0.1)
Basophils Relative: 0.3 % (ref 0.0–3.0)
Eosinophils Absolute: 0.1 K/uL (ref 0.0–0.7)
Eosinophils Relative: 1.9 % (ref 0.0–5.0)
HCT: 39 % (ref 39.0–52.0)
Hemoglobin: 12.5 g/dL — ABNORMAL LOW (ref 13.0–17.0)
Lymphocytes Relative: 41.8 % (ref 12.0–46.0)
Lymphs Abs: 2.9 K/uL (ref 0.7–4.0)
MCHC: 32 g/dL (ref 30.0–36.0)
MCV: 88.6 fl (ref 78.0–100.0)
Monocytes Absolute: 0.5 K/uL (ref 0.1–1.0)
Monocytes Relative: 7.8 % (ref 3.0–12.0)
Neutro Abs: 3.4 K/uL (ref 1.4–7.7)
Neutrophils Relative %: 48.2 % (ref 43.0–77.0)
Platelets: 237 K/uL (ref 150.0–400.0)
RBC: 4.4 Mil/uL (ref 4.22–5.81)
RDW: 13.9 % (ref 11.5–15.5)
WBC: 7 K/uL (ref 4.0–10.5)

## 2023-10-09 LAB — COMPREHENSIVE METABOLIC PANEL
ALT: 20 U/L (ref 0–53)
AST: 17 U/L (ref 0–37)
Albumin: 4.2 g/dL (ref 3.5–5.2)
Alkaline Phosphatase: 70 U/L (ref 39–117)
BUN: 11 mg/dL (ref 6–23)
CO2: 32 meq/L (ref 19–32)
Calcium: 9.1 mg/dL (ref 8.4–10.5)
Chloride: 104 meq/L (ref 96–112)
Creatinine, Ser: 1.06 mg/dL (ref 0.40–1.50)
GFR: 78.95 mL/min (ref >60.00)
Glucose, Bld: 98 mg/dL (ref 70–99)
Potassium: 4.4 meq/L (ref 3.5–5.1)
Sodium: 142 meq/L (ref 135–145)
Total Bilirubin: 0.3 mg/dL (ref 0.2–1.2)
Total Protein: 7.2 g/dL (ref 6.0–8.3)

## 2023-10-09 LAB — LIPID PANEL
Cholesterol: 163 mg/dL (ref 0–200)
HDL: 47.4 mg/dL (ref >39.00)
LDL Cholesterol: 99 mg/dL (ref 0–99)
NonHDL: 115.33
Total CHOL/HDL Ratio: 3
Triglycerides: 81 mg/dL (ref 0.0–149.0)
VLDL: 16.2 mg/dL (ref 0.0–40.0)

## 2023-10-09 LAB — MICROALBUMIN / CREATININE URINE RATIO
Creatinine,U: 191.1 mg/dL
Microalb Creat Ratio: 0.4 mg/g (ref 0.0–30.0)
Microalb, Ur: <0.7 mg/dL (ref 0.0–1.9)

## 2023-10-09 MED ORDER — TIRZEPATIDE 5 MG/0.5ML ~~LOC~~ SOAJ: 5.0000 mg | SUBCUTANEOUS | 3 refills | Status: DC

## 2023-10-09 NOTE — Assessment & Plan Note (Signed)
Well-controlled diabetes with hemoglobin A1c of 6.0 Recommend to stop Ozempic and start Mounjaro 5 mg weekly Will increase dose after 2 weeks if side effects tolerated. Continue rosuvastatin 20 mg daily Cardiovascular risks associated with diabetes and dyslipidemia discussed Diet and nutrition discussed Benefits of exercise discussed Recommend follow-up in 3 months

## 2023-10-09 NOTE — Assessment & Plan Note (Signed)
Well-controlled symptoms Continue tamsulosin 0.4 mg daily and Rapaflo 8 mg daily

## 2023-10-09 NOTE — Assessment & Plan Note (Signed)
Benefits of exercise discussed Advised to decrease amount of daily carbohydrate intake and daily calories and increase amount of plant-based protein in his diet Recommend weekly Collin Murphy

## 2023-10-09 NOTE — Progress Notes (Signed)
Collin Murphy 57 y.o.   Chief Complaint  Patient presents with   Medical Management of Chronic Issues    F/u appt, patient wants to know if he can be switched over the [facility], he states the Ozempic is not working     HISTORY OF PRESENT ILLNESS: This is a 57 y.o. male here for follow-up of chronic medical problems including diabetes Last office visit with me May 2023 Has been on Ozempic.  Not losing weight.  Wants to switch to [facility]. Other than his weight, patient states he is doing well overall.  No other complaints or medical concerns today.  Lab Results  Component Value Date   HGBA1C 6.6 (H) 05/02/2022   Wt Readings from Last 3 Encounters:  10/09/23 (!) 300 lb 8 oz (136.3 kg)  05/16/22 (!) 302 lb (137 kg)  05/02/22 299 lb 4 oz (135.7 kg)     HPI   Prior to Admission medications   Medication Sig Start Date End Date Taking? Authorizing Provider  cetirizine (ZYRTEC) 10 MG tablet Take 10 mg by mouth daily as needed for allergies.   Yes [provider]  fexofenadine (ALLEGRA) 180 MG tablet Take 180 mg by mouth daily as needed for allergies or rhinitis.   Yes [provider]  rosuvastatin (CRESTOR) 20 MG tablet Take 1 tablet (20 mg total) by mouth daily. 10/04/21  Yes Dawnn Nam, Eilleen Kempf, MD  tamsulosin (FLOMAX) 0.4 MG CAPS capsule TAKE 1 CAPSULE BY MOUTH EVERYDAY AT BEDTIME 12/04/22  Yes Laiken Nohr, Eilleen Kempf, MD  albuterol (VENTOLIN HFA) 108 (90 Base) MCG/ACT inhaler Inhale 2 puffs into the lungs every 6 (six) hours as needed for wheezing or shortness of breath. Patient not taking: Reported on 10/09/2023 10/23/21   Flinchum, Eula Fried, FNP  benzonatate (TESSALON) 100 MG capsule Take 1 capsule (100 mg total) by mouth 2 (two) times daily as needed for cough. Patient not taking: Reported on 10/09/2023 10/23/21   Flinchum, Eula Fried, FNP  furosemide (LASIX) 20 MG tablet Take 1 tablet (20 mg total) by mouth daily as needed for fluid or edema. Patient  not taking: Reported on 10/09/2023 06/22/20   Olive Bass, FNP  meloxicam (MOBIC) 15 MG tablet Take 1 tablet (15 mg total) by mouth daily. Patient not taking: Reported on 10/09/2023 03/21/22   Hetty Blend L, NP-C  metFORMIN (GLUCOPHAGE) 500 MG tablet Take 1 tablet (500 mg total) by mouth 2 (two) times daily with a meal. Patient not taking: Reported on 10/09/2023 10/06/21   Georgina Quint, MD  methocarbamol (ROBAXIN) 500 MG tablet Take 1 tablet (500 mg total) by mouth every 8 (eight) hours as needed for muscle spasms. Patient not taking: Reported on 10/09/2023 03/21/22   Avanell Shackleton, NP-C  promethazine-dextromethorphan (PROMETHAZINE-DM) 6.25-15 MG/5ML syrup Take 5 mLs by mouth at bedtime as needed for cough. Patient not taking: Reported on 10/09/2023 01/10/23   Elenore Paddy, NP  Semaglutide,0.25 or 0.5MG /DOS, (OZEMPIC, 0.25 OR 0.5 MG/DOSE,) 2 MG/3ML SOPN INJECT 0.5 MG INTO THE SKIN ONCE A WEEK. Patient not taking: Reported on 10/09/2023 12/04/22   Georgina Quint, MD  silodosin (RAPAFLO) 8 MG CAPS capsule Take 8 mg by mouth daily. 05/18/21   [provider]  Triamcinolone Acetonide (NASACORT AQ NA) Place 1 spray into the nose daily as needed (allergies).     [provider]    No Known Allergies  Patient Active Problem List   Diagnosis Date Noted   Sinusitis 01/10/2023  Dizziness 05/02/2022   Musculoskeletal back pain 05/02/2022   Dyslipidemia associated with type 2 diabetes mellitus (HCC) 05/02/2022   History of wheezing 10/23/2021   Benign prostatic hyperplasia with lower urinary tract symptoms 10/04/2021   Diabetes mellitus without complication (HCC) 02/11/2019   Bilateral lower extremity edema 06/28/2016   Morbid obesity (HCC) 03/05/2013   Obstructive sleep apnea 09/21/2009   HYPERCHOLESTEROLEMIA 08/26/2009   Gastroesophageal reflux disease 09/03/2007    Past Medical History:  Diagnosis Date   ALLERGIC RHINITIS 09/03/2007   Allergy     GERD 09/03/2007   HYPERCHOLESTEROLEMIA 08/26/2009   HYPERSOMNIA 08/26/2009   OBSTRUCTIVE SLEEP APNEA 09/21/2009   Prostatitis    multiple episodes   Sleep apnea    CPAP use sometimes   TINEA PEDIS 09/03/2007    Past Surgical History:  Procedure Laterality Date   TOE AMPUTATION     3rd and 4th toes amputated after forklift injury    Social History   Socioeconomic History   Marital status: Married    Spouse name: Not on file   Number of children: 2   Years of education: 16   Highest education level: Bachelor's degree (e.g., BA, AB, BS)  Occupational History   Occupation: Mental health and Pharmacist, hospital    Employer: [employer]  Tobacco Use   Smoking status: Light Smoker    Types: Cigars   Smokeless tobacco: Never   Tobacco comments:    Very rarely. - 2x ayear   Vaping Use   Vaping status: Never Used  Substance and Sexual Activity   Alcohol use: Yes    Comment: maybe monthly per pt.   Drug use: No   Sexual activity: Yes    Partners: Female  Other Topics Concern   Not on file  Social History Narrative   HSG. College [employer] sociology. Completed [employer]. Married-1990   Children : 1 boy '93, 1 girl '98. Work Emergency planning/management officer.            Social Determinants of Health   Financial Resource Strain: Patient Declined (10/08/2023)   Overall Financial Resource Strain (CARDIA)    Difficulty of Paying Living Expenses: Patient declined  Food Insecurity: Patient Declined (10/08/2023)   Hunger Vital Sign    Worried About Running Out of Food in the Last Year: Patient declined    Ran Out of Food in the Last Year: Patient declined  Transportation Needs: Patient Declined (10/08/2023)   PRAPARE - Administrator, Civil Service (Medical): Patient declined    Lack of Transportation (Non-Medical): Patient declined  Physical Activity: Unknown (10/08/2023)   Exercise Vital Sign    Days of Exercise per Week: 5 days    Minutes of Exercise per Session:  Patient declined  Stress: No Stress Concern Present (10/08/2023)   [employer] of Occupational Health - Occupational Stress Questionnaire    Feeling of Stress : Only a little  Social Connections: Unknown (10/08/2023)   Social Connection and Isolation Panel [NHANES]    Frequency of Communication with Friends and Family: Patient declined    Frequency of Social Gatherings with Friends and Family: Patient declined    Attends Religious Services: Patient declined    Database administrator or Organizations: Patient declined    Attends Engineer, structural: Not on file    Marital Status: Married  Catering manager Violence: Not on file    Family History  Problem Relation Age of Onset   Hypertension Mother    Heart disease Mother  CAD   Diabetes Father    Gout Brother    COPD Brother    Cancer Neg Hx        No history Colon, Prostate Breast CA   Colon cancer Neg Hx    Esophageal cancer Neg Hx    Stomach cancer Neg Hx      Review of Systems  Constitutional: Negative.  Negative for chills and fever.  HENT: Negative.  Negative for congestion and sore throat.   Respiratory: Negative.  Negative for cough and shortness of breath.   Cardiovascular: Negative.  Negative for chest pain and palpitations.  Gastrointestinal:  Negative for abdominal pain, diarrhea, nausea and vomiting.  Genitourinary: Negative.  Negative for dysuria and hematuria.  Musculoskeletal: Negative.   Skin: Negative.  Negative for rash.  Neurological: Negative.  Negative for dizziness and headaches.  All other systems reviewed and are negative.   Vitals:   10/09/23 0940  BP: 120/78  Pulse: 61  Temp: 98.3 F (36.8 C)  SpO2: 95%    Physical Exam Vitals reviewed.  Constitutional:      Appearance: Normal appearance. He is obese.  HENT:     Head: Normocephalic.     Mouth/Throat:     Mouth: Mucous membranes are moist.     Pharynx: Oropharynx is clear.  Eyes:     Extraocular Movements:  Extraocular movements intact.     Pupils: Pupils are equal, round, and reactive to light.  Cardiovascular:     Rate and Rhythm: Normal rate and regular rhythm.     Pulses: Normal pulses.     Heart sounds: Normal heart sounds.  Pulmonary:     Effort: Pulmonary effort is normal.     Breath sounds: Normal breath sounds.  Musculoskeletal:     Cervical back: No tenderness.  Lymphadenopathy:     Cervical: No cervical adenopathy.  Skin:    General: Skin is warm and dry.     Capillary Refill: Capillary refill takes less than 2 seconds.  Neurological:     General: No focal deficit present.     Mental Status: He is alert and oriented to person, place, and time.  Psychiatric:        Mood and Affect: Mood normal.        Behavior: Behavior normal.    Results for orders placed or performed in visit on 10/09/23 (from the past 24 hour(s))  POCT HgB A1C     Status: Abnormal   Collection Time: 10/09/23 10:26 AM  Result Value Ref Range   Hemoglobin A1C 6.0 (A) 4.0 - 5.6 %   HbA1c POC (<> result, manual entry)     HbA1c, POC (prediabetic range)     HbA1c, POC (controlled diabetic range)        ASSESSMENT & PLAN: A total of 46 minutes was spent with the patient and counseling/coordination of care regarding preparing for this visit, review of most recent office visit notes, review of multiple chronic medical conditions under management, cardiovascular risks associated with diabetes and dyslipidemia, review of most recent blood work results including interpretation of today's hemoglobin A1c, review of all medications and changes made, education on nutrition, review of health maintenance items, prognosis, documentation, and need for follow-up.  Problem List Items Addressed This Visit       Respiratory   Obstructive sleep apnea    Stable on CPAP treatment        Endocrine   Dyslipidemia associated with type 2 diabetes mellitus (HCC) - Primary  Well-controlled diabetes with hemoglobin A1c  of 6.0 Recommend to stop Ozempic and start Mounjaro 5 mg weekly Will increase dose after 2 weeks if side effects tolerated. Continue rosuvastatin 20 mg daily Cardiovascular risks associated with diabetes and dyslipidemia discussed Diet and nutrition discussed Benefits of exercise discussed Recommend follow-up in 3 months      Relevant Medications   tirzepatide (MOUNJARO) 5 MG/0.5ML Pen   Other Relevant Orders   POCT HgB A1C   Urine Microalbumin w/creat. ratio   CBC with Differential/Platelet   Comprehensive metabolic panel   Lipid panel     Genitourinary   Benign prostatic hyperplasia with lower urinary tract symptoms    Well-controlled symptoms Continue tamsulosin 0.4 mg daily and Rapaflo 8 mg daily        Other   Morbid obesity (HCC)    Benefits of exercise discussed Advised to decrease amount of daily carbohydrate intake and daily calories and increase amount of plant-based protein in his diet Recommend weekly Mounjaro      Relevant Medications   tirzepatide (MOUNJARO) 5 MG/0.5ML Pen   Other Visit Diagnoses     At high risk for cardiovascular disease       Relevant Medications   tirzepatide [facility]) 5 MG/0.5ML Pen      Patient Instructions  Diabetes Mellitus and Nutrition, Adult When you have diabetes, or diabetes mellitus, it is very important to have healthy eating habits because your blood sugar (glucose) levels are greatly affected by what you eat and drink. Eating healthy foods in the right amounts, at about the same times every day, can help you: Manage your blood glucose. Lower your risk of heart disease. Improve your blood pressure. Reach or maintain a healthy weight. What can affect my meal plan? Every person with diabetes is different, and each person has different needs for a meal plan. Your health care provider may recommend that you work with a dietitian to make a meal plan that is best for you. Your meal plan may vary depending on factors such  as: The calories you need. The medicines you take. Your weight. Your blood glucose, blood pressure, and cholesterol levels. Your activity level. Other health conditions you have, such as heart or kidney disease. How do carbohydrates affect me? Carbohydrates, also called carbs, affect your blood glucose level more than any other type of food. Eating carbs raises the amount of glucose in your blood. It is important to know how many carbs you can safely have in each meal. This is different for every person. Your dietitian can help you calculate how many carbs you should have at each meal and for each snack. How does alcohol affect me? Alcohol can cause a decrease in blood glucose (hypoglycemia), especially if you use insulin or take certain diabetes medicines by mouth. Hypoglycemia can be a life-threatening condition. Symptoms of hypoglycemia, such as sleepiness, dizziness, and confusion, are similar to symptoms of having too much alcohol. Do not drink alcohol if: Your health care provider tells you not to drink. You are pregnant, may be pregnant, or are planning to become pregnant. If you drink alcohol: Limit how much you have to: 0-1 drink a day for women. 0-2 drinks a day for men. Know how much alcohol is in your drink. In the [place] one drink equals one 12 oz bottle of beer (355 mL), one 5 oz glass of wine (148 mL), or one 1 oz glass of hard liquor (44 mL). Keep yourself hydrated with water, diet soda, or  unsweetened iced tea. Keep in mind that regular soda, juice, and other mixers may contain a lot of sugar and must be counted as carbs. What are tips for following this plan?  Reading food labels Start by checking the serving size on the Nutrition Facts label of packaged foods and drinks. The number of calories and the amount of carbs, fats, and other nutrients listed on the label are based on one serving of the item. Many items contain more than one serving per package. Check the total  grams (g) of carbs in one serving. Check the number of grams of saturated fats and trans fats in one serving. Choose foods that have a low amount or none of these fats. Check the number of milligrams (mg) of salt (sodium) in one serving. Most people should limit total sodium intake to less than 2,300 mg per day. Always check the nutrition information of foods labeled as "low-fat" or "nonfat." These foods may be higher in added sugar or refined carbs and should be avoided. Talk to your dietitian to identify your daily goals for nutrients listed on the label. Shopping Avoid buying canned, pre-made, or processed foods. These foods tend to be high in fat, sodium, and added sugar. Shop around the outside edge of the grocery store. This is where you will most often find fresh fruits and vegetables, bulk grains, fresh meats, and fresh dairy products. Cooking Use low-heat cooking methods, such as baking, instead of high-heat cooking methods, such as deep frying. Cook using healthy oils, such as olive, canola, or sunflower oil. Avoid cooking with butter, cream, or high-fat meats. Meal planning Eat meals and snacks regularly, preferably at the same times every day. Avoid going long periods of time without eating. Eat foods that are high in fiber, such as fresh fruits, vegetables, beans, and whole grains. Eat 4-6 oz (112-168 g) of lean protein each day, such as lean meat, chicken, fish, eggs, or tofu. One ounce (oz) (28 g) of lean protein is equal to: 1 oz (28 g) of meat, chicken, or fish. 1 egg.  cup (62 g) of tofu. Eat some foods each day that contain healthy fats, such as avocado, nuts, seeds, and fish. What foods should I eat? Fruits Berries. Apples. Oranges. Peaches. Apricots. Plums. Grapes. Mangoes. Papayas. Pomegranates. Kiwi. Cherries. Vegetables Leafy greens, including lettuce, spinach, kale, chard, collard greens, mustard greens, and cabbage. Beets. Cauliflower. Broccoli. Carrots. Green  beans. Tomatoes. Peppers. Onions. Cucumbers. Brussels sprouts. Grains Whole grains, such as whole-wheat or whole-grain bread, crackers, tortillas, cereal, and pasta. Unsweetened oatmeal. Quinoa. Brown or wild rice. Meats and other proteins Seafood. Poultry without skin. Lean cuts of poultry and beef. Tofu. Nuts. Seeds. Dairy Low-fat or fat-free dairy products such as milk, yogurt, and cheese. The items listed above may not be a complete list of foods and beverages you can eat and drink. Contact a dietitian for more information. What foods should I avoid? Fruits Fruits canned with syrup. Vegetables Canned vegetables. Frozen vegetables with butter or cream sauce. Grains Refined white flour and flour products such as bread, pasta, snack foods, and cereals. Avoid all processed foods. Meats and other proteins Fatty cuts of meat. Poultry with skin. Breaded or fried meats. Processed meat. Avoid saturated fats. Dairy Full-fat yogurt, cheese, or milk. Beverages Sweetened drinks, such as soda or iced tea. The items listed above may not be a complete list of foods and beverages you should avoid. Contact a dietitian for more information. Questions to ask a health care  provider Do I need to meet with a certified diabetes care and education specialist? Do I need to meet with a dietitian? What number can I call if I have questions? When are the best times to check my blood glucose? Where to find more information: American Diabetes Association: diabetes.org Academy of Nutrition and Dietetics: eatright.[employer] of Diabetes and Digestive and Kidney Diseases: [employer]Sync.si Association of Diabetes Care & Education Specialists: diabeteseducator.org Summary It is important to have healthy eating habits because your blood sugar (glucose) levels are greatly affected by what you eat and drink. It is important to use alcohol carefully. A healthy meal plan will help you manage your blood glucose  and lower your risk of heart disease. Your health care provider may recommend that you work with a dietitian to make a meal plan that is best for you. This information is not intended to replace advice given to you by your health care provider. Make sure you discuss any questions you have with your health care provider. Document Revised: 06/29/2020 Document Reviewed: 06/29/2020 Elsevier Patient Education  2024 Elsevier Inc.    Edwina Barth, MD [facility]

## 2023-10-09 NOTE — Patient Instructions (Signed)

## 2023-10-09 NOTE — Assessment & Plan Note (Signed)
Stable on CPAP treatment.

## 2023-12-15 ENCOUNTER — Other Ambulatory Visit: Payer: Self-pay | Admitting: Emergency Medicine

## 2023-12-15 DIAGNOSIS — N401 Enlarged prostate with lower urinary tract symptoms: Secondary | ICD-10-CM

## 2024-01-28 ENCOUNTER — Other Ambulatory Visit: Payer: Self-pay

## 2024-01-28 ENCOUNTER — Ambulatory Visit
Admission: RE | Admit: 2024-01-28 | Discharge: 2024-01-28 | Disposition: A | Discharge status: Home / Self Care | Payer: 59 | Source: Ambulatory Visit | Attending: Family Medicine | Admitting: Family Medicine

## 2024-01-28 VITALS — BP 165/90 | HR 70 | Temp 99.3°F | Resp 18

## 2024-01-28 DIAGNOSIS — J101 Influenza due to other identified influenza virus with other respiratory manifestations: Secondary | ICD-10-CM | POA: Diagnosis not present

## 2024-01-28 LAB — POC COVID19/FLU A&B COMBO
Covid Antigen, POC: NEGATIVE
Influenza A Antigen, POC: POSITIVE — AB
Influenza B Antigen, POC: NEGATIVE

## 2024-01-28 MED ORDER — OSELTAMIVIR PHOSPHATE 75 MG PO CAPS: 75.0000 mg | ORAL_CAPSULE | Freq: Two times a day (BID) | ORAL | 0 refills | Status: AC

## 2024-01-28 NOTE — ED Provider Notes (Signed)
 [facility]    CSN: [id number] Arrival date & time: 01/28/24  1041      History   Chief Complaint Chief Complaint  Patient presents with   Cough    COVID Test - Entered by patient    HPI Collin Murphy is a 58 y.o. male.  Patient here today with cough for approximately 4 days however over the last 2 days he has developed fever, generalized bodyaches, congestion, headache and fatigue.  He reports that his wife was diagnosed with influenza a week ago. Patient denies any shortness of breath or wheezing.  Reports he is coughing a lot however is not coughing anything up.  Patient endorses he has been taking over-the-counter medications.  Pressure is elevated on arrival and he has no diagnosis of hypertension.  Reports his blood pressure is normally within the right normal range. Past Medical History:  Diagnosis Date   ALLERGIC RHINITIS 09/03/2007   Allergy    GERD 09/03/2007   HYPERCHOLESTEROLEMIA 08/26/2009   HYPERSOMNIA 08/26/2009   OBSTRUCTIVE SLEEP APNEA 09/21/2009   Prostatitis    multiple episodes   Sleep apnea    CPAP use sometimes   TINEA PEDIS 09/03/2007    Patient Active Problem List   Diagnosis Date Noted   Sinusitis 01/10/2023   Dyslipidemia associated with type 2 diabetes mellitus (HCC) 05/02/2022   History of wheezing 10/23/2021   Benign prostatic hyperplasia with lower urinary tract symptoms 10/04/2021   Diabetes mellitus without complication (HCC) 02/11/2019   Morbid obesity (HCC) 03/05/2013   Obstructive sleep apnea 09/21/2009   HYPERCHOLESTEROLEMIA 08/26/2009   Gastroesophageal reflux disease 09/03/2007    Past Surgical History:  Procedure Laterality Date   TOE AMPUTATION     3rd and 4th toes amputated after forklift injury       Home Medications    Prior to Admission medications   Medication Sig Start Date End Date Taking? Authorizing Provider  oseltamivir (TAMIFLU) 75 MG capsule Take 1 capsule (75 mg total) by mouth 2 (two) times  daily. 01/28/24  Yes Bing Neighbors, NP  cetirizine (ZYRTEC) 10 MG tablet Take 10 mg by mouth daily as needed for allergies.    [provider]  fexofenadine (ALLEGRA) 180 MG tablet Take 180 mg by mouth daily as needed for allergies or rhinitis.    [provider]  metFORMIN (GLUCOPHAGE) 500 MG tablet Take 1 tablet (500 mg total) by mouth 2 (two) times daily with a meal. Patient not taking: Reported on 10/09/2023 10/06/21   Georgina Quint, MD  rosuvastatin (CRESTOR) 20 MG tablet Take 1 tablet (20 mg total) by mouth daily. 10/04/21   Georgina Quint, MD  silodosin (RAPAFLO) 8 MG CAPS capsule Take 8 mg by mouth daily. 05/18/21   [provider]  tamsulosin (FLOMAX) 0.4 MG CAPS capsule TAKE 1 CAPSULE BY MOUTH EVERYDAY AT BEDTIME 12/15/23   Sagardia, Eilleen Kempf, MD  tirzepatide [facility]) 5 MG/0.5ML Pen Inject 5 mg into the skin once a week. 10/09/23   Georgina Quint, MD  Triamcinolone Acetonide (NASACORT AQ NA) Place 1 spray into the nose daily as needed (allergies).     [provider]    Family History Family History  Problem Relation Age of Onset   Hypertension Mother    Heart disease Mother        CAD   Diabetes Father    Gout Brother    COPD Brother    Cancer Neg Hx  No history Colon, Prostate Breast CA   Colon cancer Neg Hx    Esophageal cancer Neg Hx    Stomach cancer Neg Hx     Social History Social History   Tobacco Use   Smoking status: Light Smoker    Types: Cigars   Smokeless tobacco: Never   Tobacco comments:    Very rarely. - 2x ayear   Vaping Use   Vaping status: Never Used  Substance Use Topics   Alcohol use: Yes    Comment: maybe monthly per pt.   Drug use: No     Allergies   Patient has no known allergies.   Review of Systems Review of Systems  Respiratory:  Positive for cough.      Physical Exam Triage Vital Signs ED Triage Vitals  Encounter Vitals Group     BP 01/28/24 1113 (!)  165/90     Systolic BP Percentile --      Diastolic BP Percentile --      Pulse Rate 01/28/24 1113 70     Resp 01/28/24 1113 18     Temp 01/28/24 1113 99.3 F (37.4 C)     Temp Source 01/28/24 1113 Oral     SpO2 01/28/24 1113 95 %     Weight --      Height --      Head Circumference --      Peak Flow --      Pain Score 01/28/24 1114 2     Pain Loc --      Pain Education --      Exclude from Growth Chart --    No data found.  Updated Vital Signs BP (!) 165/90 (BP Location: Left Arm)   Pulse 70   Temp 99.3 F (37.4 C) (Oral)   Resp 18   SpO2 95%   Visual Acuity Right Eye Distance:   Left Eye Distance:   Bilateral Distance:    Right Eye Near:   Left Eye Near:    Bilateral Near:     Physical Exam Vitals reviewed.  Constitutional:      Appearance: He is ill-appearing.  HENT:     Head: Normocephalic and atraumatic.     Nose: Congestion and rhinorrhea present.  Eyes:     Extraocular Movements: Extraocular movements intact.     Pupils: Pupils are equal, round, and reactive to light.  Cardiovascular:     Rate and Rhythm: Normal rate and regular rhythm.  Pulmonary:     Effort: Pulmonary effort is normal.     Breath sounds: Normal breath sounds.  Musculoskeletal:        General: Normal range of motion.     Cervical back: Normal range of motion and neck supple.  Skin:    General: Skin is warm and dry.  Neurological:     General: No focal deficit present.     Mental Status: He is alert and oriented to person, place, and time.      UC Treatments / Results  Labs (all labs ordered are listed, but only abnormal results are displayed) Labs Reviewed  POC COVID19/FLU A&B COMBO - Abnormal; Notable for the following components:      Result Value   Influenza A Antigen, POC Positive (*)    All other components within normal limits    EKG   Radiology No results found.  Procedures Procedures (including critical care time)  Medications Ordered in  UC Medications - No data to display  Initial  Impression / Assessment and Plan / UC Course  I have reviewed the triage vital signs and the nursing notes.  Pertinent labs & imaging results that were available during my care of the patient were reviewed by me and considered in my medical decision making (see chart for details).    Influenza A confirmed by point of care testing.  Tamiflu treatment dose prescribed.  Patient will continue with over-the-counter medication but recommended Mucinex , Delsym, or plain Robitussin.  Patient advised to avoid cough and cold combinations. Hydrate well with fluids.  Return precautions given if symptoms do not improve.  If any red flag symptoms develop patient is advised to go immediately to the emergency department. Final Clinical Impressions(s) / UC Diagnoses   Final diagnoses:  Influenza A     Discharge Instructions      Influenza test is positive.  You are considered contagious to others as long as you have a measurable fever with a temperature 100 F.  You should consider yourself infectious until you are fever free for 24 hours without fever lowering medications. Continue to alternate Tylenol  for management of fever, body aches, or headache. Over the counter cough and cold medication such as Robitussin, Mucinex, or Delsym are good for cough. Force fluids to maintain hydration. Tamiflu twice daily for the next 5 days to reduce symptoms and course of influenza virus.   If you develop any shortness of breath, wheezing or difficulty breathing go immediately to the nearest emergency department.      ED Prescriptions     Medication Sig Dispense Auth. Provider   oseltamivir (TAMIFLU) 75 MG capsule Take 1 capsule (75 mg total) by mouth 2 (two) times daily. 10 capsule Bing Neighbors, NP      PDMP not reviewed this encounter.   Bing Neighbors, NP 01/30/24 [phone]

## 2024-01-28 NOTE — ED Triage Notes (Signed)
 Pt here for cough and congestion x 4 days; pt sts wife had flu last week

## 2024-01-28 NOTE — Discharge Instructions (Signed)
 Influenza test is positive.  You are considered contagious to others as long as you have a measurable fever with a temperature 100 F.  You should consider yourself infectious until you are fever free for 24 hours without fever lowering medications. Continue to alternate Tylenol  for management of fever, body aches, or headache. Over the counter cough and cold medication such as Robitussin, Mucinex, or Delsym are good for cough. Force fluids to maintain hydration. Tamiflu twice daily for the next 5 days to reduce symptoms and course of influenza virus.   If you develop any shortness of breath, wheezing or difficulty breathing go immediately to the nearest emergency department.

## 2024-03-24 ENCOUNTER — Encounter: Payer: Self-pay | Admitting: Emergency Medicine

## 2024-03-26 ENCOUNTER — Other Ambulatory Visit: Payer: Self-pay | Admitting: Emergency Medicine

## 2024-03-26 MED ORDER — TIRZEPATIDE 7.5 MG/0.5ML ~~LOC~~ SOAJ: 7.5000 mg | SUBCUTANEOUS | 3 refills | Status: AC

## 2024-03-26 NOTE — Telephone Encounter (Signed)
New prescription for Mounjaro 7.5 mg sent to pharmacy of record today.  Thanks.

## 2024-05-28 ENCOUNTER — Ambulatory Visit
Admission: EM | Admit: 2024-05-28 | Discharge: 2024-05-28 | Disposition: A | Discharge status: Home / Self Care | Attending: Physician Assistant | Admitting: Physician Assistant

## 2024-05-28 ENCOUNTER — Encounter: Payer: Self-pay | Admitting: Emergency Medicine

## 2024-05-28 DIAGNOSIS — M25561 Pain in right knee: Secondary | ICD-10-CM | POA: Diagnosis not present

## 2024-05-28 MED ORDER — PREDNISONE 20 MG PO TABS
40.0000 mg | ORAL_TABLET | Freq: Every day | ORAL | 0 refills | Status: AC
Start: 2024-05-28 — End: 2024-06-02

## 2024-05-28 NOTE — ED Triage Notes (Signed)
 Pt presents c/o right knee pain since Saturday. Pt denies any falls or injuries. Pt says this is the first time it has hurt him this bad.

## 2024-05-28 NOTE — ED Provider Notes (Signed)
 [facility]    CSN: [id number] Arrival date & time: 05/28/24  1028      History   Chief Complaint Chief Complaint  Patient presents with   Knee Pain    HPI Collin Murphy is a 58 y.o. male.   Patient presents today with right knee pain that started about 5 days ago.  He reports that he has not had any injury or falls.  He states this is the first time he has had this significant of knee pain.  He notes pain is present mostly to medial anterior joint line.  He denies any numbness or weakness.  He has tried using a knee sleeve without resolution.  The history is provided by the patient.  Knee Pain Associated symptoms: no fever     Past Medical History:  Diagnosis Date   ALLERGIC RHINITIS 09/03/2007   Allergy    GERD 09/03/2007   HYPERCHOLESTEROLEMIA 08/26/2009   HYPERSOMNIA 08/26/2009   OBSTRUCTIVE SLEEP APNEA 09/21/2009   Prostatitis    multiple episodes   Sleep apnea    CPAP use sometimes   TINEA PEDIS 09/03/2007    Patient Active Problem List   Diagnosis Date Noted   Sinusitis 01/10/2023   Dyslipidemia associated with type 2 diabetes mellitus (HCC) 05/02/2022   History of wheezing 10/23/2021   Benign prostatic hyperplasia with lower urinary tract symptoms 10/04/2021   Diabetes mellitus without complication (HCC) 02/11/2019   Morbid obesity (HCC) 03/05/2013   Obstructive sleep apnea 09/21/2009   HYPERCHOLESTEROLEMIA 08/26/2009   Gastroesophageal reflux disease 09/03/2007    Past Surgical History:  Procedure Laterality Date   TOE AMPUTATION     3rd and 4th toes amputated after forklift injury       Home Medications    Prior to Admission medications   Medication Sig Start Date End Date Taking? Authorizing Provider  predniSONE  (DELTASONE ) 20 MG tablet Take 2 tablets (40 mg total) by mouth daily with breakfast for 5 days. 05/28/24 06/02/24 Yes Vernestine Gondola, PA-C  cetirizine (ZYRTEC) 10 MG tablet Take 10 mg by mouth daily as needed for  allergies.    [provider]  fexofenadine (ALLEGRA) 180 MG tablet Take 180 mg by mouth daily as needed for allergies or rhinitis.    [provider]  metFORMIN  (GLUCOPHAGE ) 500 MG tablet Take 1 tablet (500 mg total) by mouth 2 (two) times daily with a meal. Patient not taking: Reported on 10/09/2023 10/06/21   Sagardia, Miguel Jose, MD  oseltamivir  (TAMIFLU ) 75 MG capsule Take 1 capsule (75 mg total) by mouth 2 (two) times daily. 01/28/24   Buena Carmine, NP  rosuvastatin  (CRESTOR ) 20 MG tablet Take 1 tablet (20 mg total) by mouth daily. 10/04/21   Elvira Hammersmith, MD  silodosin (RAPAFLO) 8 MG CAPS capsule Take 8 mg by mouth daily. 05/18/21   [provider]  tamsulosin  (FLOMAX ) 0.4 MG CAPS capsule TAKE 1 CAPSULE BY MOUTH EVERYDAY AT BEDTIME 12/15/23   Sagardia, Isidro Margo, MD  tirzepatide  (MOUNJARO ) 7.5 MG/0.5ML Pen Inject 7.5 mg into the skin once a week. 03/26/24   Sagardia, Miguel Jose, MD  Triamcinolone Acetonide (NASACORT AQ NA) Place 1 spray into the nose daily as needed (allergies).     [provider]    Family History Family History  Problem Relation Age of Onset   Hypertension Mother    Heart disease Mother        CAD   Diabetes Father    Gout  Brother    COPD Brother    Cancer Neg Hx        No history Colon, Prostate Breast CA   Colon cancer Neg Hx    Esophageal cancer Neg Hx    Stomach cancer Neg Hx     Social History Social History   Tobacco Use   Smoking status: Light Smoker    Types: Cigars    Passive exposure: Current   Smokeless tobacco: Never   Tobacco comments:    Very rarely. - 2x ayear   Vaping Use   Vaping status: Never Used  Substance Use Topics   Alcohol use: Yes    Comment: maybe monthly per pt.   Drug use: No     Allergies   Patient has no known allergies.   Review of Systems Review of Systems  Constitutional:  Negative for chills and fever.  Eyes:  Negative for discharge and redness.   Respiratory:  Negative for shortness of breath.   Musculoskeletal:  Positive for arthralgias.  Neurological:  Negative for numbness.     Physical Exam Triage Vital Signs ED Triage Vitals  Encounter Vitals Group     BP      Girls Systolic BP Percentile      Girls Diastolic BP Percentile      Boys Systolic BP Percentile      Boys Diastolic BP Percentile      Pulse      Resp      Temp      Temp src      SpO2      Weight      Height      Head Circumference      Peak Flow      Pain Score      Pain Loc      Pain Education      Exclude from Growth Chart    No data found.  Updated Vital Signs BP 139/80 (BP Location: Left Arm)   Pulse (!) 55   Temp (!) 97.4 F (36.3 C) (Oral)   Resp 18   Wt (!) 300 lb 7.8 oz (136.3 kg)   SpO2 98%   BMI 44.37 kg/m   Visual Acuity Right Eye Distance:   Left Eye Distance:   Bilateral Distance:    Right Eye Near:   Left Eye Near:    Bilateral Near:     Physical Exam Vitals and nursing note reviewed.  Constitutional:      General: He is not in acute distress.    Appearance: Normal appearance. He is not ill-appearing.  HENT:     Head: Normocephalic and atraumatic.   Eyes:     Conjunctiva/sclera: Conjunctivae normal.    Cardiovascular:     Rate and Rhythm: Normal rate.  Pulmonary:     Effort: Pulmonary effort is normal. No respiratory distress.   Musculoskeletal:     Comments: Full range of motion of right knee.  Mild tenderness noted to medial anterior joint line, no tenderness otherwise.  No right calf tenderness.  No swelling or erythema of right calf or knee.   Neurological:     Mental Status: He is alert.   Psychiatric:        Mood and Affect: Mood normal.        Behavior: Behavior normal.        Thought Content: Thought content normal.      UC Treatments / Results  Labs (all labs ordered are listed, but  only abnormal results are displayed) Labs Reviewed - No data to display  EKG   Radiology No results  found.  Procedures Procedures (including critical care time)  Medications Ordered in UC Medications - No data to display  Initial Impression / Assessment and Plan / UC Course  I have reviewed the triage vital signs and the nursing notes.  Pertinent labs & imaging results that were available during my care of the patient were reviewed by me and considered in my medical decision making (see chart for details).    Suspect inflammatory cause of symptoms.  Will treat with prednisone  and advised follow-up with Ortho if no gradual improvement or with any further concerns.  Contact information provided for same.  Final Clinical Impressions(s) / UC Diagnoses   Final diagnoses:  Acute pain of right knee   Discharge Instructions   None    ED Prescriptions     Medication Sig Dispense Auth. Provider   predniSONE  (DELTASONE ) 20 MG tablet Take 2 tablets (40 mg total) by mouth daily with breakfast for 5 days. 10 tablet Vernestine Gondola, PA-C      PDMP not reviewed this encounter.   Vernestine Gondola, PA-C 05/28/24 1252

## 2024-10-20 ENCOUNTER — Encounter: Payer: Self-pay | Admitting: Emergency Medicine

## 2024-10-20 ENCOUNTER — Ambulatory Visit: Admitting: Emergency Medicine

## 2024-10-20 VITALS — BP 126/84 | HR 65 | Temp 98.1°F | Ht 69.0 in | Wt 293.0 lb

## 2024-10-20 DIAGNOSIS — Z7985 Long-term (current) use of injectable non-insulin antidiabetic drugs: Secondary | ICD-10-CM | POA: Diagnosis not present

## 2024-10-20 DIAGNOSIS — E785 Hyperlipidemia, unspecified: Secondary | ICD-10-CM | POA: Diagnosis not present

## 2024-10-20 DIAGNOSIS — K219 Gastro-esophageal reflux disease without esophagitis: Secondary | ICD-10-CM

## 2024-10-20 DIAGNOSIS — G4733 Obstructive sleep apnea (adult) (pediatric): Secondary | ICD-10-CM | POA: Diagnosis not present

## 2024-10-20 DIAGNOSIS — Z13228 Encounter for screening for other metabolic disorders: Secondary | ICD-10-CM

## 2024-10-20 DIAGNOSIS — Z130 Encounter for screening for diseases of the blood and blood-forming organs and certain disorders involving the immune mechanism: Secondary | ICD-10-CM | POA: Diagnosis not present

## 2024-10-20 DIAGNOSIS — N401 Enlarged prostate with lower urinary tract symptoms: Secondary | ICD-10-CM

## 2024-10-20 DIAGNOSIS — Z125 Encounter for screening for malignant neoplasm of prostate: Secondary | ICD-10-CM | POA: Diagnosis not present

## 2024-10-20 DIAGNOSIS — E1169 Type 2 diabetes mellitus with other specified complication: Secondary | ICD-10-CM | POA: Diagnosis not present

## 2024-10-20 DIAGNOSIS — Z1329 Encounter for screening for other suspected endocrine disorder: Secondary | ICD-10-CM | POA: Diagnosis not present

## 2024-10-20 DIAGNOSIS — Z0001 Encounter for general adult medical examination with abnormal findings: Secondary | ICD-10-CM

## 2024-10-20 DIAGNOSIS — Z0000 Encounter for general adult medical examination without abnormal findings: Secondary | ICD-10-CM | POA: Diagnosis not present

## 2024-10-20 LAB — TSH: TSH: 2.77 u[IU]/mL (ref 0.35–5.50)

## 2024-10-20 LAB — VITAMIN D 25 HYDROXY (VIT D DEFICIENCY, FRACTURES): VITD: 25.67 ng/mL — ABNORMAL LOW (ref 30.00–100.00)

## 2024-10-20 LAB — CBC WITH DIFFERENTIAL/PLATELET
Basophils Absolute: 0 K/uL (ref 0.0–0.1)
Basophils Relative: 0.2 % (ref 0.0–3.0)
Eosinophils Absolute: 0.1 K/uL (ref 0.0–0.7)
Eosinophils Relative: 1.4 % (ref 0.0–5.0)
HCT: 40.3 % (ref 39.0–52.0)
Hemoglobin: 13.4 g/dL (ref 13.0–17.0)
Lymphocytes Relative: 43.3 % (ref 12.0–46.0)
Lymphs Abs: 3.3 K/uL (ref 0.7–4.0)
MCHC: 33.1 g/dL (ref 30.0–36.0)
MCV: 87 fl (ref 78.0–100.0)
Monocytes Absolute: 0.6 K/uL (ref 0.1–1.0)
Monocytes Relative: 8 % (ref 3.0–12.0)
Neutro Abs: 3.6 K/uL (ref 1.4–7.7)
Neutrophils Relative %: 47.1 % (ref 43.0–77.0)
Platelets: 244 K/uL (ref 150.0–400.0)
RBC: 4.63 Mil/uL (ref 4.22–5.81)
RDW: 13.7 % (ref 11.5–15.5)
WBC: 7.7 K/uL (ref 4.0–10.5)

## 2024-10-20 LAB — COMPREHENSIVE METABOLIC PANEL WITH GFR
ALT: 19 U/L (ref 0–53)
AST: 17 U/L (ref 0–37)
Albumin: 4.6 g/dL (ref 3.5–5.2)
Alkaline Phosphatase: 70 U/L (ref 39–117)
BUN: 17 mg/dL (ref 6–23)
CO2: 28 meq/L (ref 19–32)
Calcium: 9.5 mg/dL (ref 8.4–10.5)
Chloride: 102 meq/L (ref 96–112)
Creatinine, Ser: 1.15 mg/dL (ref 0.40–1.50)
GFR: 71.08 mL/min (ref >60.00)
Glucose, Bld: 82 mg/dL (ref 70–99)
Potassium: 4.6 meq/L (ref 3.5–5.1)
Sodium: 140 meq/L (ref 135–145)
Total Bilirubin: 0.4 mg/dL (ref 0.2–1.2)
Total Protein: 7.4 g/dL (ref 6.0–8.3)

## 2024-10-20 LAB — MICROALBUMIN / CREATININE URINE RATIO
Creatinine,U: 187 mg/dL
Microalb Creat Ratio: UNDETERMINED mg/g (ref 0.0–30.0)
Microalb, Ur: <0.7 mg/dL

## 2024-10-20 LAB — VITAMIN B12: Vitamin B-12: 354 pg/mL (ref 211–911)

## 2024-10-20 LAB — LIPID PANEL
Cholesterol: 190 mg/dL (ref 0–200)
HDL: 50.9 mg/dL (ref >39.00)
LDL Cholesterol: 125 mg/dL — ABNORMAL HIGH (ref 0–99)
NonHDL: 139.31
Total CHOL/HDL Ratio: 4
Triglycerides: 74 mg/dL (ref 0.0–149.0)
VLDL: 14.8 mg/dL (ref 0.0–40.0)

## 2024-10-20 LAB — PSA: PSA: 1.03 ng/mL (ref 0.10–4.00)

## 2024-10-20 LAB — HEMOGLOBIN A1C: Hgb A1c MFr Bld: 6.3 % (ref 4.6–6.5)

## 2024-10-20 NOTE — Patient Instructions (Signed)
 Health Maintenance, Male  Adopting a healthy lifestyle and getting preventive care are important in promoting health and wellness. Ask your health care provider about:  The right schedule for you to have regular tests and exams.  Things you can do on your own to prevent diseases and keep yourself healthy.  What should I know about diet, weight, and exercise?  Eat a healthy diet    Eat a diet that includes plenty of vegetables, fruits, low-fat dairy products, and lean protein.  Do not eat a lot of foods that are high in solid fats, added sugars, or sodium.  Maintain a healthy weight  Body mass index (BMI) is a measurement that can be used to identify possible weight problems. It estimates body fat based on height and weight. Your health care provider can help determine your BMI and help you achieve or maintain a healthy weight.  Get regular exercise  Get regular exercise. This is one of the most important things you can do for your health. Most adults should:  Exercise for at least 150 minutes each week. The exercise should increase your heart rate and make you sweat (moderate-intensity exercise).  Do strengthening exercises at least twice a week. This is in addition to the moderate-intensity exercise.  Spend less time sitting. Even light physical activity can be beneficial.  Watch cholesterol and blood lipids  Have your blood tested for lipids and cholesterol at 56 years of age, then have this test every 5 years.  You may need to have your cholesterol levels checked more often if:  Your lipid or cholesterol levels are high.  You are older than 56 years of age.  You are at high risk for heart disease.  What should I know about cancer screening?  Many types of cancers can be detected early and may often be prevented. Depending on your health history and family history, you may need to have cancer screening at various ages. This may include screening for:  Colorectal cancer.  Prostate cancer.  Skin cancer.  Lung  cancer.  What should I know about heart disease, diabetes, and high blood pressure?  Blood pressure and heart disease  High blood pressure causes heart disease and increases the risk of stroke. This is more likely to develop in people who have high blood pressure readings or are overweight.  Talk with your health care provider about your target blood pressure readings.  Have your blood pressure checked:  Every 3-5 years if you are 24-52 years of age.  Every year if you are 3 years old or older.  If you are between the ages of 60 and 72 and are a current or former smoker, ask your health care provider if you should have a one-time screening for abdominal aortic aneurysm (AAA).  Diabetes  Have regular diabetes screenings. This checks your fasting blood sugar level. Have the screening done:  Once every three years after age 66 if you are at a normal weight and have a low risk for diabetes.  More often and at a younger age if you are overweight or have a high risk for diabetes.  What should I know about preventing infection?  Hepatitis B  If you have a higher risk for hepatitis B, you should be screened for this virus. Talk with your health care provider to find out if you are at risk for hepatitis B infection.  Hepatitis C  Blood testing is recommended for:  Everyone born from [birth year] through 1963.  Anyone  with known risk factors for hepatitis C.  Sexually transmitted infections (STIs)  You should be screened each year for STIs, including gonorrhea and chlamydia, if:  You are sexually active and are younger than 56 years of age.  You are older than 56 years of age and your health care provider tells you that you are at risk for this type of infection.  Your sexual activity has changed since you were last screened, and you are at increased risk for chlamydia or gonorrhea. Ask your health care provider if you are at risk.  Ask your health care provider about whether you are at high risk for HIV. Your health care provider  may recommend a prescription medicine to help prevent HIV infection. If you choose to take medicine to prevent HIV, you should first get tested for HIV. You should then be tested every 3 months for as long as you are taking the medicine.  Follow these instructions at home:  Alcohol use  Do not drink alcohol if your health care provider tells you not to drink.  If you drink alcohol:  Limit how much you have to 0-2 drinks a day.  Know how much alcohol is in your drink. In the [place] one drink equals one 12 oz bottle of beer (355 mL), one 5 oz glass of wine (148 mL), or one 1 oz glass of hard liquor (44 mL).  Lifestyle  Do not use any products that contain nicotine or tobacco. These products include cigarettes, chewing tobacco, and vaping devices, such as e-cigarettes. If you need help quitting, ask your health care provider.  Do not use street drugs.  Do not share needles.  Ask your health care provider for help if you need support or information about quitting drugs.  General instructions  Schedule regular health, dental, and eye exams.  Stay current with your vaccines.  Tell your health care provider if:  You often feel depressed.  You have ever been abused or do not feel safe at home.  Summary  Adopting a healthy lifestyle and getting preventive care are important in promoting health and wellness.  Follow your health care provider's instructions about healthy diet, exercising, and getting tested or screened for diseases.  Follow your health care provider's instructions on monitoring your cholesterol and blood pressure.  This information is not intended to replace advice given to you by your health care provider. Make sure you discuss any questions you have with your health care provider.  Document Revised: 04/17/2021 Document Reviewed: 04/17/2021  Elsevier Patient Education  2024 [employer].

## 2024-10-20 NOTE — Assessment & Plan Note (Signed)
 Hemoglobin A1c done today Continue Mounjaro  7.5 mg weekly Not taking metformin  Not taking rosuvastatin  Blood work done today Cardiovascular risks associated with diabetes and dyslipidemia discussed Diet and nutrition discussed Advised to decrease amount of daily carbohydrate intake and daily calories and increase amount of plant based protein in his diet. Recommend follow-up in 6 months

## 2024-10-20 NOTE — Assessment & Plan Note (Signed)
 Stable. Asymptomatic.

## 2024-10-20 NOTE — Assessment & Plan Note (Signed)
 Benefits of exercise discussed Advised to decrease amount of daily carbohydrate intake and daily calories and increase amount of plant-based protein in his diet Recommend to continue weekly Mounjaro 

## 2024-10-20 NOTE — Assessment & Plan Note (Signed)
 Well-controlled.  Continue Flomax  0.4 mg at bedtime

## 2024-10-20 NOTE — Assessment & Plan Note (Signed)
 Stable on CPAP treatment.

## 2024-10-20 NOTE — Progress Notes (Signed)
 Collin Murphy 58 y.o.   Chief Complaint  Patient presents with   Annual Exam    Pt would like to discuss about his monjuro pt doesn't see the results     HISTORY OF PRESENT ILLNESS: This is a 58 y.o. male here for annual exam and follow-up on chronic medical conditions including diabetes and dyslipidemia. Has some questions about Mounjaro .  Not losing as much weight as expected. Otherwise doing well.  No other complaints or medical concerns today. Wt Readings from Last 3 Encounters:  10/20/24 293 lb (132.9 kg)  05/28/24 (!) 300 lb 7.8 oz (136.3 kg)  10/09/23 (!) 300 lb 8 oz (136.3 kg)     HPI   Prior to Admission medications   Medication Sig Start Date End Date Taking? Authorizing Provider  cetirizine (ZYRTEC) 10 MG tablet Take 10 mg by mouth daily as needed for allergies.   Yes [provider]  tamsulosin  (FLOMAX ) 0.4 MG CAPS capsule TAKE 1 CAPSULE BY MOUTH EVERYDAY AT BEDTIME 12/15/23  Yes Vitaly Wanat, Emil Schanz, MD  tirzepatide  (MOUNJARO ) 7.5 MG/0.5ML Pen Inject 7.5 mg into the skin once a week. 03/26/24  Yes Rook Maue Jose, MD  fexofenadine (ALLEGRA) 180 MG tablet Take 180 mg by mouth daily as needed for allergies or rhinitis. Patient not taking: Reported on 10/20/2024    [provider]  metFORMIN  (GLUCOPHAGE ) 500 MG tablet Take 1 tablet (500 mg total) by mouth 2 (two) times daily with a meal. Patient not taking: Reported on 10/09/2023 10/06/21   Bailie Christenbury Jose, MD  oseltamivir  (TAMIFLU ) 75 MG capsule Take 1 capsule (75 mg total) by mouth 2 (two) times daily. Patient not taking: Reported on 10/20/2024 01/28/24   Arloa Suzen RAMAN, NP  rosuvastatin  (CRESTOR ) 20 MG tablet Take 1 tablet (20 mg total) by mouth daily. Patient not taking: Reported on 10/20/2024 10/04/21   Purcell Emil Schanz, MD  silodosin (RAPAFLO) 8 MG CAPS capsule Take 8 mg by mouth daily. Patient not taking: Reported on 10/20/2024 05/18/21   [provider]   Triamcinolone Acetonide (NASACORT AQ NA) Place 1 spray into the nose daily as needed (allergies).     [provider]    No Known Allergies  Patient Active Problem List   Diagnosis Date Noted   Sinusitis 01/10/2023   Dyslipidemia associated with type 2 diabetes mellitus (HCC) 05/02/2022   Benign prostatic hyperplasia with lower urinary tract symptoms 10/04/2021   Diabetes mellitus without complication (HCC) 02/11/2019   Morbid obesity (HCC) 03/05/2013   Obstructive sleep apnea 09/21/2009   HYPERCHOLESTEROLEMIA 08/26/2009   Gastroesophageal reflux disease 09/03/2007    Past Medical History:  Diagnosis Date   ALLERGIC RHINITIS 09/03/2007   Allergy    GERD 09/03/2007   HYPERCHOLESTEROLEMIA 08/26/2009   HYPERSOMNIA 08/26/2009   OBSTRUCTIVE SLEEP APNEA 09/21/2009   Prostatitis    multiple episodes   Sleep apnea    CPAP use sometimes   TINEA PEDIS 09/03/2007    Past Surgical History:  Procedure Laterality Date   TOE AMPUTATION     3rd and 4th toes amputated after forklift injury    Social History   Socioeconomic History   Marital status: Married    Spouse name: Not on file   Number of children: 2   Years of education: 16   Highest education level: Bachelor's degree (e.g., BA, AB, BS)  Occupational History   Occupation: Mental health and Pharmacist, hospital    Employer: [employer]  Tobacco Use   Smoking  status: Light Smoker    Types: Cigars    Passive exposure: Current   Smokeless tobacco: Never   Tobacco comments:    Very rarely. - 2x ayear   Vaping Use   Vaping status: Never Used  Substance and Sexual Activity   Alcohol use: Yes    Comment: maybe monthly per pt.   Drug use: No   Sexual activity: Yes    Partners: Female  Other Topics Concern   Not on file  Social History Narrative   HSG. College [employer] sociology. Completed [employer]. Married-1990   Children : 1 boy '93, 1 girl '98. Work emergency planning/management officer.            Social Drivers of  Health   Financial Resource Strain: Patient Declined (10/08/2023)   Overall Financial Resource Strain (CARDIA)    Difficulty of Paying Living Expenses: Patient declined  Food Insecurity: Patient Declined (10/08/2023)   Hunger Vital Sign    Worried About Running Out of Food in the Last Year: Patient declined    Ran Out of Food in the Last Year: Patient declined  Transportation Needs: Patient Declined (10/08/2023)   PRAPARE - Administrator, Civil Service (Medical): Patient declined    Lack of Transportation (Non-Medical): Patient declined  Physical Activity: Unknown (10/08/2023)   Exercise Vital Sign    Days of Exercise per Week: 5 days    Minutes of Exercise per Session: Patient declined  Stress: No Stress Concern Present (10/08/2023)   [employer] of Occupational Health - Occupational Stress Questionnaire    Feeling of Stress : Only a little  Social Connections: Unknown (10/08/2023)   Social Connection and Isolation Panel    Frequency of Communication with Friends and Family: Patient declined    Frequency of Social Gatherings with Friends and Family: Patient declined    Attends Religious Services: Patient declined    Database Administrator or Organizations: Patient declined    Attends Engineer, Structural: Not on file    Marital Status: Married  Catering Manager Violence: Not on file    Family History  Problem Relation Age of Onset   Hypertension Mother    Heart disease Mother        CAD   Diabetes Father    Gout Brother    COPD Brother    Cancer Neg Hx        No history Colon, Prostate Breast CA   Colon cancer Neg Hx    Esophageal cancer Neg Hx    Stomach cancer Neg Hx      Review of Systems  Constitutional: Negative.  Negative for chills and fever.  HENT: Negative.  Negative for congestion and sore throat.   Respiratory: Negative.  Negative for cough and shortness of breath.   Cardiovascular: Negative.  Negative for chest pain and  palpitations.  Gastrointestinal:  Negative for abdominal pain, diarrhea, nausea and vomiting.  Genitourinary: Negative.  Negative for dysuria and hematuria.  Skin: Negative.  Negative for rash.  Neurological: Negative.  Negative for dizziness and headaches.  All other systems reviewed and are negative.   Vitals:   10/20/24 1352  BP: 126/84  Pulse: 65  Temp: 98.1 F (36.7 C)  SpO2: 93%    Physical Exam Vitals reviewed.  Constitutional:      Appearance: Normal appearance. He is obese.  HENT:     Head: Normocephalic.     Right Ear: Tympanic membrane, ear canal and external ear normal.  Left Ear: Tympanic membrane, ear canal and external ear normal.     Mouth/Throat:     Mouth: Mucous membranes are moist.     Pharynx: Oropharynx is clear.  Eyes:     Extraocular Movements: Extraocular movements intact.     Pupils: Pupils are equal, round, and reactive to light.  Cardiovascular:     Rate and Rhythm: Normal rate and regular rhythm.     Pulses: Normal pulses.     Heart sounds: Normal heart sounds.  Pulmonary:     Effort: Pulmonary effort is normal.     Breath sounds: Normal breath sounds.  Abdominal:     Palpations: Abdomen is soft.     Tenderness: There is no abdominal tenderness.  Musculoskeletal:     Cervical back: No tenderness.  Lymphadenopathy:     Cervical: No cervical adenopathy.  Skin:    General: Skin is warm and dry.  Neurological:     General: No focal deficit present.     Mental Status: He is alert and oriented to person, place, and time.  Psychiatric:        Mood and Affect: Mood normal.        Behavior: Behavior normal.      ASSESSMENT & PLAN: Problem List Items Addressed This Visit       Respiratory   Obstructive sleep apnea   Stable on CPAP treatment        Digestive   Gastroesophageal reflux disease   Stable.  Asymptomatic.        Endocrine   Dyslipidemia associated with type 2 diabetes mellitus (HCC)   Hemoglobin A1c done  today Continue Mounjaro  7.5 mg weekly Not taking metformin  Not taking rosuvastatin  Blood work done today Cardiovascular risks associated with diabetes and dyslipidemia discussed Diet and nutrition discussed Advised to decrease amount of daily carbohydrate intake and daily calories and increase amount of plant based protein in his diet. Recommend follow-up in 6 months      Relevant Orders   CBC with Differential/Platelet   Comprehensive metabolic panel with GFR   Hemoglobin A1c   Lipid panel   TSH   Vitamin B12   VITAMIN D 25 Hydroxy (Vit-D Deficiency, Fractures)   PSA   Microalbumin / creatinine urine ratio     Genitourinary   Benign prostatic hyperplasia with lower urinary tract symptoms   Well-controlled.  Continue Flomax  0.4 mg at bedtime        Other   Morbid obesity (HCC)   Benefits of exercise discussed Advised to decrease amount of daily carbohydrate intake and daily calories and increase amount of plant-based protein in his diet Recommend to continue weekly Mounjaro       Relevant Orders   CBC with Differential/Platelet   Comprehensive metabolic panel with GFR   Hemoglobin A1c   Lipid panel   TSH   Vitamin B12   VITAMIN D 25 Hydroxy (Vit-D Deficiency, Fractures)   PSA   Microalbumin / creatinine urine ratio   Other Visit Diagnoses       Encounter for general adult medical examination with abnormal findings    -  Primary   Relevant Orders   CBC with Differential/Platelet   Comprehensive metabolic panel with GFR   Hemoglobin A1c   Lipid panel   TSH   Vitamin B12   VITAMIN D 25 Hydroxy (Vit-D Deficiency, Fractures)     Screening for deficiency anemia       Relevant Orders   CBC with Differential/Platelet  Screening for endocrine, metabolic and immunity disorder       Relevant Orders   Comprehensive metabolic panel with GFR   TSH   Vitamin B12   VITAMIN D 25 Hydroxy (Vit-D Deficiency, Fractures)     Screening for prostate cancer       Relevant  Orders   PSA      Modifiable risk factors discussed with patient. Anticipatory guidance according to age provided. The following topics were also discussed: Social Determinants of Health Smoking.  Non-smoker Diet and nutrition Benefits of exercise Cancer screening and review of most recent colonoscopy report Vaccinations review and recommendations Cardiovascular risk assessment and need for blood work today The 10-year ASCVD risk score (Arnett DK, et al., 2019) is: 19.9%   Values used to calculate the score:     Age: 31 years     Clincally relevant sex: Male     Is Non-Hispanic African American: Yes     Diabetic: Yes     Tobacco smoker: Yes     Systolic Blood Pressure: 126 mmHg     Is BP treated: No     HDL Cholesterol: 47.4 mg/dL     Total Cholesterol: 163 mg/dL Review of multiple chronic medical conditions under management Review of all medications Mental health including depression and anxiety Fall and accident prevention  Patient Instructions  Health Maintenance, Male Adopting a healthy lifestyle and getting preventive care are important in promoting health and wellness. Ask your health care provider about: The right schedule for you to have regular tests and exams. Things you can do on your own to prevent diseases and keep yourself healthy. What should I know about diet, weight, and exercise? Eat a healthy diet  Eat a diet that includes plenty of vegetables, fruits, low-fat dairy products, and lean protein. Do not eat a lot of foods that are high in solid fats, added sugars, or sodium. Maintain a healthy weight Body mass index (BMI) is a measurement that can be used to identify possible weight problems. It estimates body fat based on height and weight. Your health care provider can help determine your BMI and help you achieve or maintain a healthy weight. Get regular exercise Get regular exercise. This is one of the most important things you can do for your health.  Most adults should: Exercise for at least 150 minutes each week. The exercise should increase your heart rate and make you sweat (moderate-intensity exercise). Do strengthening exercises at least twice a week. This is in addition to the moderate-intensity exercise. Spend less time sitting. Even light physical activity can be beneficial. Watch cholesterol and blood lipids Have your blood tested for lipids and cholesterol at 56 years of age, then have this test every 5 years. You may need to have your cholesterol levels checked more often if: Your lipid or cholesterol levels are high. You are older than 56 years of age. You are at high risk for heart disease. What should I know about cancer screening? Many types of cancers can be detected early and may often be prevented. Depending on your health history and family history, you may need to have cancer screening at various ages. This may include screening for: Colorectal cancer. Prostate cancer. Skin cancer. Lung cancer. What should I know about heart disease, diabetes, and high blood pressure? Blood pressure and heart disease High blood pressure causes heart disease and increases the risk of stroke. This is more likely to develop in people who have high blood pressure readings  or are overweight. Talk with your health care provider about your target blood pressure readings. Have your blood pressure checked: Every 3-5 years if you are 35-55 years of age. Every year if you are 76 years old or older. If you are between the ages of 82 and 69 and are a current or former smoker, ask your health care provider if you should have a one-time screening for abdominal aortic aneurysm (AAA). Diabetes Have regular diabetes screenings. This checks your fasting blood sugar level. Have the screening done: Once every three years after age 70 if you are at a normal weight and have a low risk for diabetes. More often and at a younger age if you are overweight or  have a high risk for diabetes. What should I know about preventing infection? Hepatitis B If you have a higher risk for hepatitis B, you should be screened for this virus. Talk with your health care provider to find out if you are at risk for hepatitis B infection. Hepatitis C Blood testing is recommended for: Everyone born from [birth year] through 1963. Anyone with known risk factors for hepatitis C. Sexually transmitted infections (STIs) You should be screened each year for STIs, including gonorrhea and chlamydia, if: You are sexually active and are younger than 56 years of age. You are older than 56 years of age and your health care provider tells you that you are at risk for this type of infection. Your sexual activity has changed since you were last screened, and you are at increased risk for chlamydia or gonorrhea. Ask your health care provider if you are at risk. Ask your health care provider about whether you are at high risk for HIV. Your health care provider may recommend a prescription medicine to help prevent HIV infection. If you choose to take medicine to prevent HIV, you should first get tested for HIV. You should then be tested every 3 months for as long as you are taking the medicine. Follow these instructions at home: Alcohol use Do not drink alcohol if your health care provider tells you not to drink. If you drink alcohol: Limit how much you have to 0-2 drinks a day. Know how much alcohol is in your drink. In the [place] one drink equals one 12 oz bottle of beer (355 mL), one 5 oz glass of wine (148 mL), or one 1 oz glass of hard liquor (44 mL). Lifestyle Do not use any products that contain nicotine or tobacco. These products include cigarettes, chewing tobacco, and vaping devices, such as e-cigarettes. If you need help quitting, ask your health care provider. Do not use street drugs. Do not share needles. Ask your health care provider for help if you need support or information  about quitting drugs. General instructions Schedule regular health, dental, and eye exams. Stay current with your vaccines. Tell your health care provider if: You often feel depressed. You have ever been abused or do not feel safe at home. Summary Adopting a healthy lifestyle and getting preventive care are important in promoting health and wellness. Follow your health care provider's instructions about healthy diet, exercising, and getting tested or screened for diseases. Follow your health care provider's instructions on monitoring your cholesterol and blood pressure. This information is not intended to replace advice given to you by your health care provider. Make sure you discuss any questions you have with your health care provider. Document Revised: 04/17/2021 Document Reviewed: 04/17/2021 Elsevier Patient Education  2024 [employer].  Emil Schaumann, MD  Primary Care at [facility]

## 2024-10-21 ENCOUNTER — Ambulatory Visit: Payer: Self-pay | Admitting: Emergency Medicine
